# Patient Record
Sex: Female | Born: 1950
Health system: Southern US, Community
[De-identification: ages and names within clinical notes are randomized; demographics above are authoritative.]

## PROBLEM LIST (undated history)

## (undated) HISTORY — PX: OTHER SURGICAL HISTORY: SHX169

## (undated) HISTORY — PX: BUNIONECTOMY: SHX129

---

## 2006-08-16 HISTORY — PX: WRIST FRACTURE SURGERY: SHX121

## 2006-09-04 ENCOUNTER — Ambulatory Visit: Payer: Self-pay | Admitting: Unknown Physician Specialty

## 2009-09-16 HISTORY — PX: CATARACT EXTRACTION: SUR2

## 2010-07-04 ENCOUNTER — Emergency Department: Payer: Self-pay | Admitting: Unknown Physician Specialty

## 2018-10-20 ENCOUNTER — Ambulatory Visit: Payer: Medicare Other | Admitting: Family Medicine

## 2018-10-20 ENCOUNTER — Encounter: Payer: Self-pay | Admitting: Family Medicine

## 2018-10-20 VITALS — BP 124/72 | HR 72 | Temp 98.1°F | Ht 62.25 in | Wt 163.5 lb

## 2018-10-20 DIAGNOSIS — F419 Anxiety disorder, unspecified: Secondary | ICD-10-CM

## 2018-10-20 DIAGNOSIS — Z23 Encounter for immunization: Secondary | ICD-10-CM

## 2018-10-20 DIAGNOSIS — Z13220 Encounter for screening for lipoid disorders: Secondary | ICD-10-CM | POA: Diagnosis not present

## 2018-10-20 DIAGNOSIS — Z131 Encounter for screening for diabetes mellitus: Secondary | ICD-10-CM | POA: Diagnosis not present

## 2018-10-20 DIAGNOSIS — Z1159 Encounter for screening for other viral diseases: Secondary | ICD-10-CM | POA: Diagnosis not present

## 2018-10-20 DIAGNOSIS — Z1239 Encounter for other screening for malignant neoplasm of breast: Secondary | ICD-10-CM

## 2018-10-20 DIAGNOSIS — Z1211 Encounter for screening for malignant neoplasm of colon: Secondary | ICD-10-CM

## 2018-10-20 LAB — LIPID PANEL
CHOL/HDL RATIO: 5
Cholesterol: 218 mg/dL — ABNORMAL HIGH (ref 0–200)
HDL: 42.8 mg/dL (ref >39.00)
LDL Cholesterol: 140 mg/dL — ABNORMAL HIGH (ref 0–99)
NonHDL: 174.71
Triglycerides: 172 mg/dL — ABNORMAL HIGH (ref 0.0–149.0)
VLDL: 34.4 mg/dL (ref 0.0–40.0)

## 2018-10-20 LAB — HEMOGLOBIN A1C: Hgb A1c MFr Bld: 5.6 % (ref 4.6–6.5)

## 2018-10-20 MED ORDER — TETANUS-DIPHTH-ACELL PERTUSSIS 5-2-15.5 LF-MCG/0.5 IM SUSP: 0.5000 mL | Freq: Once | INTRAMUSCULAR | 0 refills | Status: AC

## 2018-10-20 NOTE — Patient Instructions (Addendum)
#  Cancer screening  - Get a Mammogram - Pick up the stool kit from the lab for colon cancer screening  #Anxiety/nausea - these do sound related - consider going to therapy - If too expensive or no improvement return to see me  #Blood work - to screen for Hepatitis C, Diabetes, and Cholesterol  #Immunizations - Go to [facility] to get your tetanus shot - We will do the Pneumonia Shot today

## 2018-10-20 NOTE — Progress Notes (Signed)
Subjective:     Alexandria Mason is a 68 y.o. female presenting for Establish Care (previous PCP Dr. Clelia Croft in 2013 or 2014. last time she was seen for an acute issue was with [facility] in 2016.) and GI Problem (sometimes nausea, stomach feels upset. Most of the time she is ok. She feels like this is related to nerves/anxiety/situational.)     GI Problem  The primary symptoms include nausea and diarrhea. Primary symptoms do not include fever, weight loss, fatigue, abdominal pain, vomiting, melena, hematemesis, hematochezia or myalgias. The illness began more than 7 days ago. The onset was gradual. The problem has not changed since onset. The illness does not include chills. Associated medical issues do not include liver disease or alcohol abuse. Risk factors: no risk factors.   Nausea is every few weeks. Only 1 day of symptoms Has been going  No heartburn symptoms Some diarrhea after eating fatty foods  #Anxiety - most days she feels well - exercises 3 days a week - possibly feeling anxious when having the nausea - hx of migraines which resolved when feeling less stress - symptoms are not daily - walks helpful, cleaning and doing tasks to help  - has never thought about therapy  Review of Systems  Constitutional: Negative for chills, fatigue, fever and weight loss.  Gastrointestinal: Positive for diarrhea and nausea. Negative for abdominal pain, hematemesis, hematochezia, melena and vomiting.  Musculoskeletal: Negative for myalgias.     Social History   Tobacco Use  Smoking Status Never Smoker  Smokeless Tobacco Never Used        Objective:    BP Readings from Last 3 Encounters:  10/20/18 124/72   Wt Readings from Last 3 Encounters:  10/20/18 163 lb 8 oz (74.2 kg)    BP 124/72   Pulse 72   Temp 98.1 F (36.7 C)   Ht 5' 2.25" (1.581 m)   Wt 163 lb 8 oz (74.2 kg)   SpO2 95%   BMI 29.66 kg/m    Physical Exam Constitutional:      General: She is  not in acute distress.    Appearance: She is well-developed. She is not diaphoretic.  HENT:     Right Ear: External ear normal.     Left Ear: External ear normal.     Nose: Nose normal.  Eyes:     Conjunctiva/sclera: Conjunctivae normal.  Neck:     Musculoskeletal: Neck supple.  Cardiovascular:     Rate and Rhythm: Normal rate and regular rhythm.     Heart sounds: No murmur.  Pulmonary:     Effort: Pulmonary effort is normal. No respiratory distress.     Breath sounds: Normal breath sounds. No wheezing.  Abdominal:     General: Bowel sounds are normal. There is no distension.     Palpations: Abdomen is soft. There is no mass.     Tenderness: There is no abdominal tenderness. There is no guarding.  Skin:    General: Skin is warm and dry.     Capillary Refill: Capillary refill takes less than 2 seconds.  Neurological:     Mental Status: She is alert. Mental status is at baseline.  Psychiatric:        Mood and Affect: Mood normal.        Behavior: Behavior normal.           Assessment & Plan:   Problem List Items Addressed This Visit      Other  Anxiety - Primary    Mildly elevated GAD. She thinks intermittent nausea may be related. Difficult to obtain clear severity of symptoms, though on questionnaire do not seem to be occurring often. Advised therapy to see if that will help articulate. While describing symptoms would run off in tangents regarding concerns for daughters health so do wonder if she is someone who may have more symptoms than she is endorsing. Continue to monitor. Start with therapy if not too expensive       Other Visit Diagnoses    Screening for diabetes mellitus       Relevant Orders   Hemoglobin A1c   Screening for hyperlipidemia       Relevant Orders   Lipid panel   Encounter for hepatitis C screening test for low risk patient       Relevant Orders   Hepatitis C antibody   Need for Tdap vaccination       Need for pneumococcal vaccination         Relevant Orders   Pneumococcal conjugate vaccine 13-valent   Colon cancer screening       Relevant Orders   Fecal occult blood, imunochemical   Breast cancer screening       Relevant Orders   MM 3D SCREEN BREAST BILATERAL     No recent primary care visit and has never done any age appropriate screening.   Will start with above. Consider DEXA at next visit  Return in about 1 year (around 10/21/2019).  Lynnda Child, MD

## 2018-10-20 NOTE — Assessment & Plan Note (Signed)
Mildly elevated GAD. She thinks intermittent nausea may be related. Difficult to obtain clear severity of symptoms, though on questionnaire do not seem to be occurring often. Advised therapy to see if that will help articulate. While describing symptoms would run off in tangents regarding concerns for daughters health so do wonder if she is someone who may have more symptoms than she is endorsing. Continue to monitor. Start with therapy if not too expensive

## 2018-10-21 ENCOUNTER — Other Ambulatory Visit: Payer: Self-pay | Admitting: Family Medicine

## 2018-10-21 DIAGNOSIS — E785 Hyperlipidemia, unspecified: Secondary | ICD-10-CM

## 2018-10-21 LAB — HEPATITIS C ANTIBODY
Hepatitis C Ab: NONREACTIVE
SIGNAL TO CUT-OFF: 0.01 (ref <1.00)

## 2018-10-21 NOTE — Telephone Encounter (Signed)
Called patient to relay lab results. Pt unavailable to left voicemail to call back  Routing to MA to discuss if patient calls back   1) Hepatitis C negative  2) No sign of diabetes  3) Cholesterol is high. Based on results taking a statin medication every day could help decrease your risk for heart attack or stroke.   -- Would also recommend regular exercise and a healthy diet.    I've pended a medication to start, if you would like we can send it to the pharmacy.   Rare side effect is muscle cramps.  If you would like to discuss in person, make an appointment and we can talk about it more detail.

## 2018-10-22 MED ORDER — ATORVASTATIN CALCIUM 10 MG PO TABS: 10.0000 mg | ORAL_TABLET | Freq: Every day | ORAL | 1 refills | Status: DC

## 2018-10-22 NOTE — Telephone Encounter (Signed)
Pt returned call for her lab results.

## 2018-10-22 NOTE — Telephone Encounter (Signed)
Spoke with patient and advised as below. Patient agrees to start medication. RX sent in to the pharmacy.

## 2018-11-16 ENCOUNTER — Encounter: Payer: Self-pay | Admitting: Family Medicine

## 2018-11-16 ENCOUNTER — Ambulatory Visit: Payer: Medicare Other | Admitting: Family Medicine

## 2018-11-16 VITALS — BP 118/62 | HR 105 | Temp 98.2°F | Resp 20 | Ht 62.25 in | Wt 160.8 lb

## 2018-11-16 DIAGNOSIS — J101 Influenza due to other identified influenza virus with other respiratory manifestations: Secondary | ICD-10-CM

## 2018-11-16 LAB — POCT INFLUENZA A/B
Influenza A, POC: POSITIVE — AB
Influenza B, POC: NEGATIVE

## 2018-11-16 MED ORDER — BENZONATATE 100 MG PO CAPS: 100.0000 mg | ORAL_CAPSULE | Freq: Two times a day (BID) | ORAL | 0 refills | Status: DC | PRN

## 2018-11-16 MED ORDER — OSELTAMIVIR PHOSPHATE 75 MG PO CAPS: 75.0000 mg | ORAL_CAPSULE | Freq: Two times a day (BID) | ORAL | 0 refills | Status: AC

## 2018-11-16 NOTE — Patient Instructions (Signed)
You have the flu.   This is highly contagious -- you are contagious 1-2 days before you develop symptoms and for up to 7 days after becoming sick.   Oseltamivir (Tamiflu) -- is helpful if started within 48 hours of symptoms. Ideally within 24 hours of symptoms. This may decrease the length of illness by 1 day and decrease the severity of your illness.   Based on your symptoms, it looks like you have a virus.     1. Drink plenty of fluids 2. Get lots of rest  Sinus Congestion 1) Neti Pot (Saline rinse) -- 2 times day -- if tolerated 2) Flonase (Store Brand ok) - once daily 3) Over the counter congestion medications  Cough 1) Cough drops can be helpful 2) Nyquil (or nighttime cough medication) 3) Honey is proven to be one of the best cough medications   Sore Throat 1) Honey as above, cough drops 2) Ibuprofen or Aleve can be helpful 3) Salt water Gargles  If you develop fevers (Temperature >100.4), chills, worsening symptoms or symptoms lasting longer than 10 days return to clinic.

## 2018-11-16 NOTE — Progress Notes (Signed)
Subjective:     Alexandria Mason is a 68 y.o. female presenting for Cough (symptoms started on 11/14/2018. Weakness, runny nose, body aches today, chills. )     URI   This is a new problem. The current episode started in the past 7 days. The problem has been gradually worsening. There has been no fever. Associated symptoms include congestion, coughing, headaches, rhinorrhea, sinus pain and vomiting. Pertinent negatives include no abdominal pain, chest pain, diarrhea, ear pain, joint pain, nausea, plugged ear sensation, sneezing or sore throat. She has tried NSAIDs (honey, vit C) for the symptoms. The treatment provided mild relief.     Review of Systems  Constitutional: Positive for chills. Negative for fever.  HENT: Positive for congestion, rhinorrhea and sinus pain. Negative for ear pain, sneezing and sore throat.   Respiratory: Positive for cough.   Cardiovascular: Negative for chest pain.  Gastrointestinal: Positive for vomiting. Negative for abdominal pain, diarrhea and nausea.  Musculoskeletal: Positive for myalgias. Negative for joint pain.  Neurological: Positive for headaches.     Social History   Tobacco Use  Smoking Status Never Smoker  Smokeless Tobacco Never Used        Objective:    BP Readings from Last 3 Encounters:  11/16/18 118/62  10/20/18 124/72   Wt Readings from Last 3 Encounters:  11/16/18 160 lb 12 oz (72.9 kg)  10/20/18 163 lb 8 oz (74.2 kg)    BP 118/62   Pulse (!) 105   Temp 98.2 F (36.8 C)   Resp 20   Ht 5' 2.25" (1.581 m)   Wt 160 lb 12 oz (72.9 kg)   SpO2 95%   BMI 29.17 kg/m    Physical Exam Constitutional:      General: She is not in acute distress.    Appearance: She is well-developed. She is not diaphoretic.  HENT:     Head: Normocephalic and atraumatic.     Right Ear: Tympanic membrane and ear canal normal.     Left Ear: Tympanic membrane and ear canal normal.     Nose: Mucosal edema and rhinorrhea present.   Right Sinus: No maxillary sinus tenderness or frontal sinus tenderness.     Left Sinus: No maxillary sinus tenderness or frontal sinus tenderness.     Mouth/Throat:     Pharynx: Uvula midline. Posterior oropharyngeal erythema present. No oropharyngeal exudate.     Tonsils: Swelling: 0 on the right. 0 on the left.  Eyes:     General: No scleral icterus.    Conjunctiva/sclera: Conjunctivae normal.  Neck:     Musculoskeletal: Neck supple.  Cardiovascular:     Rate and Rhythm: Normal rate and regular rhythm.     Heart sounds: Normal heart sounds. No murmur.  Pulmonary:     Effort: Pulmonary effort is normal. No respiratory distress.     Breath sounds: Normal breath sounds.  Lymphadenopathy:     Cervical: No cervical adenopathy.  Skin:    General: Skin is warm and dry.     Capillary Refill: Capillary refill takes less than 2 seconds.  Neurological:     Mental Status: She is alert.      Rapid flu: positive     Assessment & Plan:   Problem List Items Addressed This Visit    None    Visit Diagnoses    Influenza A    -  Primary   Relevant Medications   oseltamivir (TAMIFLU) 75 MG capsule   benzonatate (TESSALON) 100  MG capsule   Other Relevant Orders   POCT Influenza A/B (Completed)     Symptomatic care. Within the window for tamiflu.   Return if symptoms worsen or fail to improve.  Lynnda Child, MD

## 2019-04-14 ENCOUNTER — Other Ambulatory Visit: Payer: Self-pay | Admitting: Family Medicine

## 2019-04-14 DIAGNOSIS — E785 Hyperlipidemia, unspecified: Secondary | ICD-10-CM

## 2019-10-31 ENCOUNTER — Other Ambulatory Visit: Payer: Self-pay | Admitting: Family Medicine

## 2019-10-31 DIAGNOSIS — E785 Hyperlipidemia, unspecified: Secondary | ICD-10-CM

## 2019-11-01 NOTE — Telephone Encounter (Signed)
Please schedule a physical for patient when possible. Thank you

## 2019-11-03 NOTE — Telephone Encounter (Signed)
lvm asking pt to call office °

## 2019-11-12 NOTE — Telephone Encounter (Signed)
Noted. Refill sent in 

## 2019-11-12 NOTE — Telephone Encounter (Signed)
Pt is scheduled in march for physical.

## 2019-11-26 ENCOUNTER — Ambulatory Visit (INDEPENDENT_AMBULATORY_CARE_PROVIDER_SITE_OTHER): Payer: Medicare Other

## 2019-11-26 DIAGNOSIS — Z0000 Encounter for general adult medical examination without abnormal findings: Secondary | ICD-10-CM

## 2019-11-26 NOTE — Progress Notes (Signed)
Subjective:   Shawnna Pancake is a 69 y.o. female who presents for Medicare Annual (Subsequent) preventive examination.  Review of Systems: N/A   This visit is being conducted through telemedicine via telephone at the nurse health advisor's home address due to the COVID-19 pandemic. This patient has given me verbal consent via doximity to conduct this visit, patient states they are participating from their home address. Patient and myself are on the telephone call. There is no referral for this visit. Some vital signs may be absent or patient reported.    Patient identification: identified by name, DOB, and current address   Cardiac Risk Factors include: advanced age (>92men, >25 women)     Objective:     Vitals: There were no vitals taken for this visit.  There is no height or weight on file to calculate BMI.  Advanced Directives 11/26/2019  Does Patient Have a Medical Advance Directive? No  Would patient like information on creating a medical advance directive? No - Patient declined    Tobacco Social History   Tobacco Use  Smoking Status Never Smoker  Smokeless Tobacco Never Used     Counseling given: Not Answered   Clinical Intake:  Pre-visit preparation completed: Yes  Pain : No/denies pain     Nutritional Risks: None Diabetes: No  How often do you need to have someone help you when you read instructions, pamphlets, or other written materials from your doctor or pharmacy?: 1 - Never What is the last grade level you completed in school?: 2 years of college  Interpreter Needed?: No  Information entered by :: CJohnson, LPN  History reviewed. No pertinent past medical history. Past Surgical History:  Procedure Laterality Date  . BUNIONECTOMY     x 2-bilateral feet- different years for each one  . CATARACT EXTRACTION Left 2011  . oral surgeries    . WRIST FRACTURE SURGERY Right 08/2006   metal plate present   Family History  Problem Relation Age of  Onset  . Congestive Heart Failure Mother   . Heart disease Father   . Heart attack Father 28  . Heart disease Brother   . Heart attack Paternal Grandfather    Social History   Socioeconomic History  . Marital status: Divorced    Spouse name: Not on file  . Number of children: 1  . Years of education: some college  . Highest education level: Not on file  Occupational History  . Not on file  Tobacco Use  . Smoking status: Never Smoker  . Smokeless tobacco: Never Used  Substance and Sexual Activity  . Alcohol use: Not Currently    Comment: rarely  . Drug use: Never  . Sexual activity: Not Currently  Other Topics Concern  . Not on file  Social History Narrative   Living on social security, retired from [employer] alone   One daughter - Efraim Kaufmann    Enjoys: going to the gym, walking with her neighbor   Exercise: gym 3 times a week   Social support - yes, good friends, daughter   Diet: pretty healthy - salads, meats, chicken   Social Determinants of Health   Financial Resource Strain: Low Risk   . Difficulty of Paying Living Expenses: Not hard at all  Food Insecurity: No Food Insecurity  . Worried About Programme researcher, broadcasting/film/video in the Last Year: Never true  . Ran Out of Food in the Last Year: Never true  Transportation Needs: No Transportation Needs  .  Lack of Transportation (Medical): No  . Lack of Transportation (Non-Medical): No  Physical Activity: Sufficiently Active  . Days of Exercise per Week: 7 days  . Minutes of Exercise per Session: 30 min  Stress: No Stress Concern Present  . Feeling of Stress : Not at all  Social Connections:   . Frequency of Communication with Friends and Family:   . Frequency of Social Gatherings with Friends and Family:   . Attends Religious Services:   . Active Member of Clubs or Organizations:   . Attends Archivist Meetings:   Marland Kitchen Marital Status:     Outpatient Encounter Medications as of 11/26/2019  Medication Sig  .  atorvastatin (LIPITOR) 10 MG tablet TAKE 1 TABLET BY MOUTH EVERY DAY  . Naproxen Sodium (ALEVE) 220 MG CAPS Take by mouth daily as needed.  . benzonatate (TESSALON) 100 MG capsule Take 1 capsule (100 mg total) by mouth 2 (two) times daily as needed for cough. (Patient not taking: Reported on 11/26/2019)   No facility-administered encounter medications on file as of 11/26/2019.    Activities of Daily Living In your present state of health, do you have any difficulty performing the following activities: 11/26/2019  Hearing? N  Vision? N  Difficulty concentrating or making decisions? N  Walking or climbing stairs? N  Dressing or bathing? N  Doing errands, shopping? N  Preparing Food and eating ? N  Using the Toilet? N  In the past six months, have you accidently leaked urine? N  Do you have problems with loss of bowel control? N  Managing your Medications? N  Managing your Finances? N  Housekeeping or managing your Housekeeping? N  Some recent data might be hidden    Patient Care Team: Lesleigh Noe, MD as PCP - General (Family Medicine)    Assessment:   This is a routine wellness examination for [place].  Exercise Activities and Dietary recommendations Current Exercise Habits: Home exercise routine, Type of exercise: walking, Time (Minutes): 30, Frequency (Times/Week): 7, Weekly Exercise (Minutes/Week): 210, Intensity: Moderate, Exercise limited by: None identified  Goals    . Patient Stated     11/26/2019, I will continue walking everyday for 30 minutes.        Fall Risk Fall Risk  11/26/2019 10/20/2018  Falls in the past year? 1 0  Comment tripped over speed bump at [employer] -  Number falls in past yr: 0 0  Injury with Fall? 0 0  Risk for fall due to : No Fall Risks -  Follow up Falls evaluation completed;Falls prevention discussed -   Is the patient's home free of loose throw rugs in walkways, pet beds, electrical cords, etc?   yes      Grab bars in the bathroom?  no      Handrails on the stairs?   yes      Adequate lighting?   yes  Timed Get Up and Go performed: N/A  Depression Screen PHQ 2/9 Scores 11/26/2019 10/20/2018  PHQ - 2 Score 0 0  PHQ- 9 Score 0 0     Cognitive Function MMSE - Mini Mental State Exam 11/26/2019  Orientation to time 5  Orientation to Place 5  Registration 3  Attention/ Calculation 5  Recall 3  Language- repeat 1       Mini Cog  Mini-Cog screen was completed. Maximum score is 22. A value of 0 denotes this part of the MMSE was not completed or the patient failed  this part of the Mini-Cog screening.  Immunization History  Administered Date(s) Administered  . Pneumococcal Conjugate-13 10/20/2018  . Tdap 10/23/2018    Qualifies for Shingles Vaccine: Yes  Screening Tests Health Maintenance  Topic Date Due  . MAMMOGRAM  Never done  . DEXA SCAN  Never done  . PNA vac Low Risk Adult (2 of 2 - PPSV23) 10/21/2019  . COLONOSCOPY  11/26/2023 (Originally 11/06/1999)  . INFLUENZA VACCINE  12/15/2023 (Originally 04/17/2019)  . TETANUS/TDAP  10/23/2028  . Hepatitis C Screening  Completed    Cancer Screenings: Lung: Low Dose CT Chest recommended if Age 77-80 years, 30 pack-year currently smoking OR have quit w/in 15 years. Patient does not qualify. Breast: Up to date on Mammogram: No, postponed to later date   Up to date of Bone Density/Dexa: No, postponed to later date  Colorectal: declined  Additional Screenings:  Hepatitis C Screening: 10/20/2018     Plan:   Patient will continue walking daily for 30 minutes.    I have personally reviewed and noted the following in the patient's chart:   . Medical and social history . Use of alcohol, tobacco or illicit drugs  . Current medications and supplements . Functional ability and status . Nutritional status . Physical activity . Advanced directives . List of other physicians . Hospitalizations, surgeries, and ER visits in previous 12 months . Vitals . Screenings  to include cognitive, depression, and falls . Referrals and appointments  In addition, I have reviewed and discussed with patient certain preventive protocols, quality metrics, and best practice recommendations. A written personalized care plan for preventive services as well as general preventive health recommendations were provided to patient.     Janalyn Shy, LPN  4/31/5400

## 2019-11-26 NOTE — Progress Notes (Signed)
PCP notes:  Health Maintenance: Pneumovax 23- due, Patient wants to get this Flu vaccine- declined Colonoscopy- declined Mammogram- postpone til COVID better Dexa- postpone til COVID better   Abnormal Screenings: none   Patient concerns: none   Nurse concerns: none   Next PCP appt.: 11/30/2019 @ 10:40 am

## 2019-11-26 NOTE — Patient Instructions (Signed)
Alexandria Mason , Thank you for taking time to come for your Medicare Wellness Visit. I appreciate your ongoing commitment to your health goals. Please review the following plan we discussed and let me know if I can assist you in the future.   Screening recommendations/referrals: Colonoscopy: declined Mammogram: postpone Bone Density: postpone Recommended yearly ophthalmology/optometry visit for glaucoma screening and checkup Recommended yearly dental visit for hygiene and checkup  Vaccinations: Influenza vaccine: declined Pneumococcal vaccine: due Tdap vaccine: Up to date, completed 10/23/2018 Shingles vaccine: discussed    Advanced directives: Advance directive discussed with you today. Even though you declined this today please call our office should you change your mind and we can give you the proper paperwork for you to fill out.  Conditions/risks identified: none  Next appointment: 11/30/2019 @ 10:40 am    Preventive Care 65 Years and Older, Female Preventive care refers to lifestyle choices and visits with your health care provider that can promote health and wellness. What does preventive care include?  A yearly physical exam. This is also called an annual well check.  Dental exams once or twice a year.  Routine eye exams. Ask your health care provider how often you should have your eyes checked.  Personal lifestyle choices, including:  Daily care of your teeth and gums.  Regular physical activity.  Eating a healthy diet.  Avoiding tobacco and drug use.  Limiting alcohol use.  Practicing safe sex.  Taking low-dose aspirin every day.  Taking vitamin and mineral supplements as recommended by your health care provider. What happens during an annual well check? The services and screenings done by your health care provider during your annual well check will depend on your age, overall health, lifestyle risk factors, and family history of disease. Counseling  Your  health care provider may ask you questions about your:  Alcohol use.  Tobacco use.  Drug use.  Emotional well-being.  Home and relationship well-being.  Sexual activity.  Eating habits.  History of falls.  Memory and ability to understand (cognition).  Work and work Astronomer.  Reproductive health. Screening  You may have the following tests or measurements:  Height, weight, and BMI.  Blood pressure.  Lipid and cholesterol levels. These may be checked every 5 years, or more frequently if you are over 12 years old.  Skin check.  Lung cancer screening. You may have this screening every year starting at age 25 if you have a 30-pack-year history of smoking and currently smoke or have quit within the past 15 years.  Fecal occult blood test (FOBT) of the stool. You may have this test every year starting at age 61.  Flexible sigmoidoscopy or colonoscopy. You may have a sigmoidoscopy every 5 years or a colonoscopy every 10 years starting at age 56.  Hepatitis C blood test.  Hepatitis B blood test.  Sexually transmitted disease (STD) testing.  Diabetes screening. This is done by checking your blood sugar (glucose) after you have not eaten for a while (fasting). You may have this done every 1-3 years.  Bone density scan. This is done to screen for osteoporosis. You may have this done starting at age 61.  Mammogram. This may be done every 1-2 years. Talk to your health care provider about how often you should have regular mammograms. Talk with your health care provider about your test results, treatment options, and if necessary, the need for more tests. Vaccines  Your health care provider may recommend certain vaccines, such as:  Influenza  vaccine. This is recommended every year.  Tetanus, diphtheria, and acellular pertussis (Tdap, Td) vaccine. You may need a Td booster every 10 years.  Zoster vaccine. You may need this after age 53.  Pneumococcal 13-valent  conjugate (PCV13) vaccine. One dose is recommended after age 8.  Pneumococcal polysaccharide (PPSV23) vaccine. One dose is recommended after age 48. Talk to your health care provider about which screenings and vaccines you need and how often you need them. This information is not intended to replace advice given to you by your health care provider. Make sure you discuss any questions you have with your health care provider. Document Released: 09/29/2015 Document Revised: 05/22/2016 Document Reviewed: 07/04/2015 Elsevier Interactive Patient Education  2017 [place] Prevention in the Home Falls can cause injuries. They can happen to people of all ages. There are many things you can do to make your home safe and to help prevent falls. What can I do on the outside of my home?  Regularly fix the edges of walkways and driveways and fix any cracks.  Remove anything that might make you trip as you walk through a door, such as a raised step or threshold.  Trim any bushes or trees on the path to your home.  Use bright outdoor lighting.  Clear any walking paths of anything that might make someone trip, such as rocks or tools.  Regularly check to see if handrails are loose or broken. Make sure that both sides of any steps have handrails.  Any raised decks and porches should have guardrails on the edges.  Have any leaves, snow, or ice cleared regularly.  Use sand or salt on walking paths during winter.  Clean up any spills in your garage right away. This includes oil or grease spills. What can I do in the bathroom?  Use night lights.  Install grab bars by the toilet and in the tub and shower. Do not use towel bars as grab bars.  Use non-skid mats or decals in the tub or shower.  If you need to sit down in the shower, use a plastic, non-slip stool.  Keep the floor dry. Clean up any water that spills on the floor as soon as it happens.  Remove soap buildup in the tub or  shower regularly.  Attach bath mats securely with double-sided non-slip rug tape.  Do not have throw rugs and other things on the floor that can make you trip. What can I do in the bedroom?  Use night lights.  Make sure that you have a light by your bed that is easy to reach.  Do not use any sheets or blankets that are too big for your bed. They should not hang down onto the floor.  Have a firm chair that has side arms. You can use this for support while you get dressed.  Do not have throw rugs and other things on the floor that can make you trip. What can I do in the kitchen?  Clean up any spills right away.  Avoid walking on wet floors.  Keep items that you use a lot in easy-to-reach places.  If you need to reach something above you, use a strong step stool that has a grab bar.  Keep electrical cords out of the way.  Do not use floor polish or wax that makes floors slippery. If you must use wax, use non-skid floor wax.  Do not have throw rugs and other things on the floor that can  make you trip. What can I do with my stairs?  Do not leave any items on the stairs.  Make sure that there are handrails on both sides of the stairs and use them. Fix handrails that are broken or loose. Make sure that handrails are as long as the stairways.  Check any carpeting to make sure that it is firmly attached to the stairs. Fix any carpet that is loose or worn.  Avoid having throw rugs at the top or bottom of the stairs. If you do have throw rugs, attach them to the floor with carpet tape.  Make sure that you have a light switch at the top of the stairs and the bottom of the stairs. If you do not have them, ask someone to add them for you. What else can I do to help prevent falls?  Wear shoes that:  Do not have high heels.  Have rubber bottoms.  Are comfortable and fit you well.  Are closed at the toe. Do not wear sandals.  If you use a stepladder:  Make sure that it is fully  opened. Do not climb a closed stepladder.  Make sure that both sides of the stepladder are locked into place.  Ask someone to hold it for you, if possible.  Clearly mark and make sure that you can see:  Any grab bars or handrails.  First and last steps.  Where the edge of each step is.  Use tools that help you move around (mobility aids) if they are needed. These include:  Canes.  Walkers.  Scooters.  Crutches.  Turn on the lights when you go into a dark area. Replace any light bulbs as soon as they burn out.  Set up your furniture so you have a clear path. Avoid moving your furniture around.  If any of your floors are uneven, fix them.  If there are any pets around you, be aware of where they are.  Review your medicines with your doctor. Some medicines can make you feel dizzy. This can increase your chance of falling. Ask your doctor what other things that you can do to help prevent falls. This information is not intended to replace advice given to you by your health care provider. Make sure you discuss any questions you have with your health care provider. Document Released: 06/29/2009 Document Revised: 02/08/2016 Document Reviewed: 10/07/2014 Elsevier Interactive Patient Education  2017 [employer].

## 2019-11-30 ENCOUNTER — Encounter: Payer: Self-pay | Admitting: Family Medicine

## 2019-11-30 ENCOUNTER — Other Ambulatory Visit: Payer: Self-pay

## 2019-11-30 ENCOUNTER — Ambulatory Visit (INDEPENDENT_AMBULATORY_CARE_PROVIDER_SITE_OTHER): Payer: Medicare Other | Admitting: Family Medicine

## 2019-11-30 VITALS — BP 114/68 | HR 73 | Temp 97.3°F | Ht 62.25 in | Wt 173.5 lb

## 2019-11-30 DIAGNOSIS — Z1211 Encounter for screening for malignant neoplasm of colon: Secondary | ICD-10-CM

## 2019-11-30 DIAGNOSIS — H269 Unspecified cataract: Secondary | ICD-10-CM

## 2019-11-30 DIAGNOSIS — Z23 Encounter for immunization: Secondary | ICD-10-CM

## 2019-11-30 DIAGNOSIS — E782 Mixed hyperlipidemia: Secondary | ICD-10-CM

## 2019-11-30 DIAGNOSIS — Z0000 Encounter for general adult medical examination without abnormal findings: Secondary | ICD-10-CM | POA: Diagnosis not present

## 2019-11-30 LAB — LIPID PANEL
Cholesterol: 172 mg/dL (ref 0–200)
HDL: 45.9 mg/dL (ref >39.00)
LDL Cholesterol: 95 mg/dL (ref 0–99)
NonHDL: 126.19
Total CHOL/HDL Ratio: 4
Triglycerides: 154 mg/dL — ABNORMAL HIGH (ref 0.0–149.0)
VLDL: 30.8 mg/dL (ref 0.0–40.0)

## 2019-11-30 NOTE — Progress Notes (Signed)
Annual Exam   Chief Complaint:  Chief Complaint  Patient presents with  . Annual Exam    part 2    History of Present Illness:  Ms. Alexandria Mason is a 69 y.o. No obstetric history on file. who LMP was No LMP recorded. Patient is postmenopausal., presents today for her annual examination.    Covid vaccine - hoping to get the Wynetta Emery and [employer] vaccine     Nutrition She does get adequate calcium and Vitamin D in her diet. Diet: bacon, eggs, veggies, salads, beef/chicken/seafood Exercise: walks with her neighbor 1 mile daily, belongs to a gym  Safety The patient wears seatbelts: yes.     The patient feels safe at home and in their relationships: yes.   Menstrual Post menopausal  GYN She is not sexually active.    Cervical Cancer Screening:   Not indicated  Breast Cancer Screening There is no FH of breast cancer. There is no FH of ovarian cancer. BRCA screening Not Indicated.  Last Mammogram: never The patient does want a mammogram this year. -- hoping to delay due to covid   Colon Cancer Screening Age 48-75 yo - benefits outweigh the risk. Adults 4-85 yo who have never been screened benefit.  Benefits: 134000 people in 2016 will be diagnosed and 49,000 will die - early detection helps [place]: Complications 2/2 to colonoscopy High Risk (Colonoscopy): genetic disorder (Lynch syndrome or familial adenomatous polyposis), personal hx of IBD, previous adenomatous polyp, or previous colorectal cancer, FamHx start 10 years before the age at diagnosis, increased in males and black race  Options:  FIT - looks for hemoglobin (blood in the stool) - specific and fairly sensitive - must be done annually Cologuard - looks for DNA and blood - more sensitive - therefore can have more false positives, every 3 years Colonoscopy - every 10 years if normal - sedation, bowl prep, must have someone drive you  Shared decision making and the patient had decided to do FOBT.   Lung  Cancer Screening Annual screening for adults age 63-80 yo with 30 year pack history? No Current Tobacco user? No Quit less than 15 years ago? No Interested in low dose CT for lung cancer screening? not applicable    Tobacco Use: Low Risk   . Smoking Tobacco Use: Never Smoker  . Smokeless Tobacco Use: Never Used      Weight Wt Readings from Last 3 Encounters:  11/30/19 173 lb 8 oz (78.7 kg)  11/16/18 160 lb 12 oz (72.9 kg)  10/20/18 163 lb 8 oz (74.2 kg)   Patient has high BMI  BMI Readings from Last 1 Encounters:  11/30/19 31.48 kg/m     Chronic disease screening Blood pressure monitoring:  BP Readings from Last 3 Encounters:  11/30/19 114/68  11/16/18 118/62  10/20/18 124/72    Lipid Monitoring: Indication for screening: age >8, obesity, diabetes, family hx, CV risk factors.  Lipid screening: Yes  Lab Results  Component Value Date   CHOL 218 (H) 10/20/2018   HDL 42.80 10/20/2018   LDLCALC 140 (H) 10/20/2018   TRIG 172.0 (H) 10/20/2018   CHOLHDL 5 10/20/2018     Diabetes Screening: age >66, overweight, family hx, PCOS, hx of gestational diabetes, at risk ethnicity Diabetes Screening screening: Not Indicated  Lab Results  Component Value Date   HGBA1C 5.6 10/20/2018     No past medical history on file.  Past Surgical History:  Procedure Laterality Date  . BUNIONECTOMY  x 2-bilateral feet- different years for each one  . CATARACT EXTRACTION Left 2011  . oral surgeries    . WRIST FRACTURE SURGERY Right 08/2006   metal plate present    Prior to Admission medications   Medication Sig Start Date End Date Taking? Authorizing Provider  atorvastatin (LIPITOR) 10 MG tablet TAKE 1 TABLET BY MOUTH EVERY DAY 11/12/19  Yes Lesleigh Noe, MD  Naproxen Sodium (ALEVE) 220 MG CAPS Take by mouth daily as needed.   Yes [provider]  benzonatate (TESSALON) 100 MG capsule Take 1 capsule (100 mg total) by mouth 2 (two) times daily as needed for  cough. Patient not taking: Reported on 11/26/2019 11/16/18   Lesleigh Noe, MD    Allergies  Allergen Reactions  . Oxycodone-Acetaminophen     Hallucinations  . Sulfa Antibiotics     Upset stomach    Gynecologic History: No LMP recorded. Patient is postmenopausal.  Obstetric History: No obstetric history on file.  Social History   Socioeconomic History  . Marital status: Divorced    Spouse name: Not on file  . Number of children: 1  . Years of education: some college  . Highest education level: Not on file  Occupational History  . Not on file  Tobacco Use  . Smoking status: Never Smoker  . Smokeless tobacco: Never Used  Substance and Sexual Activity  . Alcohol use: Not Currently    Comment: rarely  . Drug use: Never  . Sexual activity: Not Currently  Other Topics Concern  . Not on file  Social History Narrative   Living on social security, retired from [employer] alone   One daughter - Lenna Sciara    Enjoys: going to the gym, walking with her neighbor   Exercise: gym 3 times a week   Social support - yes, good friends, daughter   Diet: pretty healthy - salads, meats, chicken   Social Determinants of Health   Financial Resource Strain: Low Risk   . Difficulty of Paying Living Expenses: Not hard at all  Food Insecurity: No Food Insecurity  . Worried About Charity fundraiser in the Last Year: Never true  . Ran Out of Food in the Last Year: Never true  Transportation Needs: No Transportation Needs  . Lack of Transportation (Medical): No  . Lack of Transportation (Non-Medical): No  Physical Activity: Sufficiently Active  . Days of Exercise per Week: 7 days  . Minutes of Exercise per Session: 30 min  Stress: No Stress Concern Present  . Feeling of Stress : Not at all  Social Connections:   . Frequency of Communication with Friends and Family:   . Frequency of Social Gatherings with Friends and Family:   . Attends Religious Services:   . Active Member of Clubs  or Organizations:   . Attends Archivist Meetings:   Marland Kitchen Marital Status:   Intimate Partner Violence: Not At Risk  . Fear of Current or Ex-Partner: No  . Emotionally Abused: No  . Physically Abused: No  . Sexually Abused: No    Family History  Problem Relation Age of Onset  . Congestive Heart Failure Mother   . Heart disease Father   . Heart attack Father 62  . Heart disease Brother   . Heart attack Paternal Grandfather     Review of Systems  Constitutional: Negative for chills and fever.  HENT: Negative for congestion and sore throat.   Eyes: Negative for blurred vision  and double vision.  Respiratory: Negative for shortness of breath.   Cardiovascular: Negative for chest pain.  Gastrointestinal: Negative for heartburn, nausea and vomiting.  Genitourinary: Negative.   Musculoskeletal: Negative.  Negative for myalgias.  Skin: Negative for rash.  Neurological: Negative for dizziness and headaches.  Endo/Heme/Allergies: Does not bruise/bleed easily.  Psychiatric/Behavioral: Negative for depression. The patient is not nervous/anxious.      Physical Exam BP 114/68   Pulse 73   Temp (!) 97.3 F (36.3 C)   Ht 5' 2.25" (1.581 m)   Wt 173 lb 8 oz (78.7 kg)   SpO2 98%   BMI 31.48 kg/m    BP Readings from Last 3 Encounters:  11/30/19 114/68  11/16/18 118/62  10/20/18 124/72      Physical Exam Constitutional:      General: She is not in acute distress.    Appearance: She is well-developed. She is not diaphoretic.  HENT:     Head: Normocephalic and atraumatic.     Right Ear: Tympanic membrane and external ear normal.     Left Ear: Tympanic membrane and external ear normal.     Nose: Nose normal.  Eyes:     General: No scleral icterus.    Conjunctiva/sclera: Conjunctivae normal.  Cardiovascular:     Rate and Rhythm: Normal rate and regular rhythm.     Heart sounds: No murmur.  Pulmonary:     Effort: Pulmonary effort is normal. No respiratory distress.      Breath sounds: Normal breath sounds. No wheezing.  Abdominal:     General: Bowel sounds are normal. There is no distension.     Palpations: Abdomen is soft. There is no mass.     Tenderness: There is no abdominal tenderness. There is no guarding or rebound.  Musculoskeletal:        General: Normal range of motion.     Cervical back: Neck supple.  Lymphadenopathy:     Cervical: No cervical adenopathy.  Skin:    General: Skin is warm and dry.     Capillary Refill: Capillary refill takes less than 2 seconds.  Neurological:     Mental Status: She is alert and oriented to person, place, and time.     Deep Tendon Reflexes: Reflexes normal.  Psychiatric:        Behavior: Behavior normal.      Results:  PHQ-9:    Clinical Support from 11/26/2019 in [place] at [facility]  PHQ-9 Total Score  0        Assessment: 69 y.o. No obstetric history on file. female here for routine annual physical examination.  Plan: Problem List Items Addressed This Visit    None    Visit Diagnoses    Annual physical exam    -  Primary   Relevant Orders   Pneumococcal polysaccharide vaccine 23-valent greater than or equal to 2yo subcutaneous/IM (Completed)   Cataract of both eyes, unspecified cataract type       Cataract of left eye, unspecified cataract type       Relevant Orders   Ambulatory referral to Ophthalmology   Colon cancer screening       Relevant Orders   Fecal occult blood, imunochemical   Mixed hyperlipidemia       Relevant Orders   Lipid panel      Screening: -- Blood pressure screen normal -- cholesterol screening: will obtain -- Weight screening: overweight: continue to monitor -- Diabetes Screening: not due for screening --  Nutrition: normal - encouraged calorie counting for weight loss and healthy diet  The 10-year ASCVD risk score Mikey Bussing DC Jr., et al., 2013) is: 8.2%   Values used to calculate the score:     Age: 43 years     Sex: Female     Is  Non-Hispanic African American: No     Diabetic: No     Tobacco smoker: No     Systolic Blood Pressure: 481 mmHg     Is BP treated: No     HDL Cholesterol: 42.8 mg/dL     Total Cholesterol: 218 mg/dL  -- Statin therapy for Age 74-75 with CVD risk >7.5%  Pt on statin will repeat to assess response  Psych -- Depression screening (PHQ-9):    Clinical Support from 11/26/2019 in [place] at [facility]  PHQ-9 Total Score  0       Safety -- tobacco screening: not using -- alcohol screening:  low-risk usage. -- no evidence of domestic violence or intimate partner violence.   Cancer Screening -- pap smear not collected per ASCCP guidelines -- family history of breast cancer screening: done. not at high risk. -- Mammogram - pt would like to defer until covid pandemic is less of a risk  Immunizations -- flu vaccine declined -- TDAP q10 years up to date -- PPSV-23 (19-64 with chronic disease or smoking) given   Pt will get the johnson and johnson covid vaccine when available  Referral for eye doctor - has not been seen in 10 years Encouraged regular exercise and diet changes    Lesleigh Noe, MD

## 2019-11-30 NOTE — Patient Instructions (Signed)
Here is what I would recommend:  1) Increase the amount of water you drink a day > specifically drink a glass of water 8 oz or more before every meal 2) Could start a fiber supplement (like metamucil) > You could take this up to 3 times a day, but I would start with 1 time a day until you get used to it. It may cause some stomach upset 3) Make sure you sit down to eat and eat slowly (cut meat one piece at a time) > specifically train your body to eat only at the table (avoiding snacking in front of the TV) 4) Fill up on healthy items first > consider eating a salad with low calorie salad dressing before every meal  5) Make 1/2 of your plate vegetables 6) Keep healthy snacks available for those times when you are bored 7) Consider using a calorie counting app like MyFitnessPal > counting calories helps you make wise choices around snacking. Or if you can afford it you could sign up for Weight Watchers -- and learn about healthy options through a point system        Preventive Care 65 Years and Older, Female Preventive care refers to lifestyle choices and visits with your health care provider that can promote health and wellness. This includes:  A yearly physical exam. This is also called an annual well check.  Regular dental and eye exams.  Immunizations.  Screening for certain conditions.  Healthy lifestyle choices, such as diet and exercise. What can I expect for my preventive care visit? Physical exam Your health care provider will check:  Height and weight. These may be used to calculate body mass index (BMI), which is a measurement that tells if you are at a healthy weight.  Heart rate and blood pressure.  Your skin for abnormal spots. Counseling Your health care provider may ask you questions about:  Alcohol, tobacco, and drug use.  Emotional well-being.  Home and relationship well-being.  Sexual activity.  Eating habits.  History of falls.  Memory and ability  to understand (cognition).  Work and work Statistician.  Pregnancy and menstrual history. What immunizations do I need?  Influenza (flu) vaccine  This is recommended every year. Tetanus, diphtheria, and pertussis (Tdap) vaccine  You may need a Td booster every 10 years. Varicella (chickenpox) vaccine  You may need this vaccine if you have not already been vaccinated. Zoster (shingles) vaccine  You may need this after age 34. Pneumococcal conjugate (PCV13) vaccine  One dose is recommended after age 29. Pneumococcal polysaccharide (PPSV23) vaccine  One dose is recommended after age 56. Measles, mumps, and rubella (MMR) vaccine  You may need at least one dose of MMR if you were born in 1957 or later. You may also need a second dose. Meningococcal conjugate (MenACWY) vaccine  You may need this if you have certain conditions. Hepatitis A vaccine  You may need this if you have certain conditions or if you travel or work in places where you may be exposed to hepatitis A. Hepatitis B vaccine  You may need this if you have certain conditions or if you travel or work in places where you may be exposed to hepatitis B. Haemophilus influenzae type b (Hib) vaccine  You may need this if you have certain conditions. You may receive vaccines as individual doses or as more than one vaccine together in one shot (combination vaccines). Talk with your health care provider about the risks and benefits of combination  vaccines. What tests do I need? Blood tests  Lipid and cholesterol levels. These may be checked every 5 years, or more frequently depending on your overall health.  Hepatitis C test.  Hepatitis B test. Screening  Lung cancer screening. You may have this screening every year starting at age 45 if you have a 30-pack-year history of smoking and currently smoke or have quit within the past 15 years.  Colorectal cancer screening. All adults should have this screening starting at  age 60 and continuing until age 3. Your health care provider may recommend screening at age 30 if you are at increased risk. You will have tests every 1-10 years, depending on your results and the type of screening test.  Diabetes screening. This is done by checking your blood sugar (glucose) after you have not eaten for a while (fasting). You may have this done every 1-3 years.  Mammogram. This may be done every 1-2 years. Talk with your health care provider about how often you should have regular mammograms.  BRCA-related cancer screening. This may be done if you have a family history of breast, ovarian, tubal, or peritoneal cancers. Other tests  Sexually transmitted disease (STD) testing.  Bone density scan. This is done to screen for osteoporosis. You may have this done starting at age 51. Follow these instructions at home: Eating and drinking  Eat a diet that includes fresh fruits and vegetables, whole grains, lean protein, and low-fat dairy products. Limit your intake of foods with high amounts of sugar, saturated fats, and salt.  Take vitamin and mineral supplements as recommended by your health care provider.  Do not drink alcohol if your health care provider tells you not to drink.  If you drink alcohol: ? Limit how much you have to 0-1 drink a day. ? Be aware of how much alcohol is in your drink. In the [place] one drink equals one 12 oz bottle of beer (355 mL), one 5 oz glass of wine (148 mL), or one 1 oz glass of hard liquor (44 mL). Lifestyle  Take daily care of your teeth and gums.  Stay active. Exercise for at least 30 minutes on 5 or more days each week.  Do not use any products that contain nicotine or tobacco, such as cigarettes, e-cigarettes, and chewing tobacco. If you need help quitting, ask your health care provider.  If you are sexually active, practice safe sex. Use a condom or other form of protection in order to prevent STIs (sexually transmitted  infections).  Talk with your health care provider about taking a low-dose aspirin or statin. What's next?  Go to your health care provider once a year for a well check visit.  Ask your health care provider how often you should have your eyes and teeth checked.  Stay up to date on all vaccines. This information is not intended to replace advice given to you by your health care provider. Make sure you discuss any questions you have with your health care provider. Document Revised: 08/27/2018 Document Reviewed: 08/27/2018 Elsevier Patient Education  2020 [employer].

## 2019-12-21 ENCOUNTER — Other Ambulatory Visit: Payer: Self-pay | Admitting: General Surgery

## 2019-12-21 DIAGNOSIS — Z1231 Encounter for screening mammogram for malignant neoplasm of breast: Secondary | ICD-10-CM

## 2020-01-25 ENCOUNTER — Encounter: Payer: Self-pay | Admitting: Family Medicine

## 2020-02-11 ENCOUNTER — Other Ambulatory Visit: Payer: Self-pay | Admitting: Family Medicine

## 2020-02-11 DIAGNOSIS — E785 Hyperlipidemia, unspecified: Secondary | ICD-10-CM

## 2020-05-14 ENCOUNTER — Other Ambulatory Visit: Payer: Self-pay | Admitting: Family Medicine

## 2020-05-14 DIAGNOSIS — E785 Hyperlipidemia, unspecified: Secondary | ICD-10-CM

## 2020-08-08 ENCOUNTER — Other Ambulatory Visit: Payer: Self-pay

## 2020-08-08 ENCOUNTER — Other Ambulatory Visit: Payer: Self-pay | Admitting: Family Medicine

## 2020-08-08 ENCOUNTER — Other Ambulatory Visit: Payer: Medicare Other

## 2020-08-08 ENCOUNTER — Telehealth (INDEPENDENT_AMBULATORY_CARE_PROVIDER_SITE_OTHER): Payer: Medicare Other | Admitting: Family Medicine

## 2020-08-08 DIAGNOSIS — E785 Hyperlipidemia, unspecified: Secondary | ICD-10-CM

## 2020-08-08 DIAGNOSIS — Z20822 Contact with and (suspected) exposure to covid-19: Secondary | ICD-10-CM

## 2020-08-08 MED ORDER — BENZONATATE 100 MG PO CAPS: 100.0000 mg | ORAL_CAPSULE | Freq: Three times a day (TID) | ORAL | 0 refills | Status: DC | PRN

## 2020-08-08 MED ORDER — ONDANSETRON HCL 4 MG PO TABS: 4.0000 mg | ORAL_TABLET | Freq: Three times a day (TID) | ORAL | 0 refills | Status: DC | PRN

## 2020-08-08 MED ORDER — AZITHROMYCIN 250 MG PO TABS: ORAL_TABLET | ORAL | 0 refills | Status: DC

## 2020-08-08 NOTE — Progress Notes (Signed)
    I connected with Alexandria Mason on 08/08/20 at  9:00 AM EST by video and verified that I am speaking with the correct person using two identifiers.   I discussed the limitations, risks, security and privacy concerns of performing an evaluation and management service by video and the availability of in person appointments. I also discussed with the patient that there may be a patient responsible charge related to this service. The patient expressed understanding and agreed to proceed.  Patient location: Home Provider Location: [place] Participants: Lynnda Child and Alexandria Mason   Subjective:     Alexandria Mason is a 69 y.o. female presenting for Cough (x 3 days), Emesis (x 2 days), and Nasal Congestion     HPI  #Cough - nausea and vomiting x 2 days - coughing but feels congested and cannot cough anything up - will get tired from coughing and her ribs - has not been vaccinated to covid-19 or prior infection - no sick contact - no loss of taste or smell - no fever/chills - endorses sinus congestion - can keep ginger ale down - yesterday has not been able to eat anything - taking cold medication for cold and flu to try and break up the cough - wanted to avoid drowsy medication - trying to keep hydrated - no lightheadedness or dizziness - no sob - gets a little tired when coughing  Review of Systems   Social History   Tobacco Use  Smoking Status Never Smoker  Smokeless Tobacco Never Used        Objective:   BP Readings from Last 3 Encounters:  11/30/19 114/68  11/16/18 118/62  10/20/18 124/72   Wt Readings from Last 3 Encounters:  11/30/19 173 lb 8 oz (78.7 kg)  11/16/18 160 lb 12 oz (72.9 kg)  10/20/18 163 lb 8 oz (74.2 kg)   There were no vitals taken for this visit.   Physical Exam   Speaking in complete sentences Alert  oriented        Assessment & Plan:   Problem List Items Addressed This Visit    None    Visit  Diagnoses    Suspected 2019 novel coronavirus infection    -  Primary     Discussed OTC treatment for viral illness Patient will come today for drive-up testing Instructed to isolate until the results come back  Due to holiday weekend coming - azithromycin prescribed and advised to wait for results of covid test and if negative and symptoms persist OK to take for possible PNA  Zofran for nausea Tessalon pearls for cough   Interactive audio and video telecommunications were attempted between this provider and patient, however failed, due to patient having technical difficulties OR patient did not have access to video capability.  We continued and completed visit with audio only.   Start Time: 8:59 End Time: 9:14    No follow-ups on file.  Lynnda Child, MD

## 2020-08-10 LAB — SARS-COV-2, NAA 2 DAY TAT

## 2020-08-10 LAB — SPECIMEN STATUS REPORT

## 2020-08-10 LAB — NOVEL CORONAVIRUS, NAA: SARS-CoV-2, NAA: DETECTED — AB

## 2020-08-11 ENCOUNTER — Other Ambulatory Visit: Payer: Self-pay | Admitting: Nurse Practitioner

## 2020-08-11 DIAGNOSIS — U071 COVID-19: Secondary | ICD-10-CM

## 2020-08-11 NOTE — Progress Notes (Signed)
I connected by phone with Alexandria Mason on 08/11/2020 at 11:11 AM to discuss the potential use of a new treatment for mild to moderate COVID-19 viral infection in non-hospitalized patients.  This patient is a 69 y.o. female that meets the FDA criteria for Emergency Use Authorization of COVID monoclonal antibody casirivimab/imdevimab, bamlanivimab/eteseviamb, or sotrovimab.  Has a (+) direct SARS-CoV-2 viral test result  Has mild or moderate COVID-19   Is NOT hospitalized due to COVID-19  Is within 10 days of symptom onset  Has at least one of the high risk factor(s) for progression to severe COVID-19 and/or hospitalization as defined in EUA.  Specific high risk criteria : Older age (>/= 70 yo) and Cardiovascular disease or hypertension   I have spoken and communicated the following to the patient or parent/caregiver regarding COVID monoclonal antibody treatment:  1. FDA has authorized the emergency use for the treatment of mild to moderate COVID-19 in adults and pediatric patients with positive results of direct SARS-CoV-2 viral testing who are 65 years of age and older weighing at least 40 kg, and who are at high risk for progressing to severe COVID-19 and/or hospitalization.  2. The significant known and potential risks and benefits of COVID monoclonal antibody, and the extent to which such potential risks and benefits are unknown.  3. Information on available alternative treatments and the risks and benefits of those alternatives, including clinical trials.  4. Patients treated with COVID monoclonal antibody should continue to self-isolate and use infection control measures (e.g., wear mask, isolate, social distance, avoid sharing personal items, clean and disinfect "high touch" surfaces, and frequent handwashing) according to CDC guidelines.   5. The patient or parent/caregiver has the option to accept or refuse COVID monoclonal antibody treatment.  After reviewing this  information with the patient, the patient has agreed to receive one of the available covid 19 monoclonal antibodies and will be provided an appropriate fact sheet prior to infusion. Alexandria Reel, NP 08/11/2020 11:11 AM

## 2020-08-12 ENCOUNTER — Ambulatory Visit (HOSPITAL_COMMUNITY)
Admission: RE | Admit: 2020-08-12 | Discharge: 2020-08-12 | Disposition: A | Discharge status: Home / Self Care | Payer: Medicare Other | Source: Ambulatory Visit | Attending: Pulmonary Disease | Admitting: Pulmonary Disease

## 2020-08-12 DIAGNOSIS — U071 COVID-19: Secondary | ICD-10-CM | POA: Insufficient documentation

## 2020-08-12 DIAGNOSIS — Z23 Encounter for immunization: Secondary | ICD-10-CM | POA: Insufficient documentation

## 2020-08-12 MED ORDER — SOTROVIMAB 500 MG/8ML IV SOLN
500.0000 mg | Freq: Once | INTRAVENOUS | Status: AC
  Administered 2020-08-12: 500 mg via INTRAVENOUS

## 2020-08-12 MED ORDER — SODIUM CHLORIDE 0.9 % IV SOLN: INTRAVENOUS | Status: DC | PRN

## 2020-08-12 MED ORDER — FAMOTIDINE IN NACL 20-0.9 MG/50ML-% IV SOLN: 20.0000 mg | Freq: Once | INTRAVENOUS | Status: DC | PRN

## 2020-08-12 MED ORDER — DIPHENHYDRAMINE HCL 50 MG/ML IJ SOLN: 50.0000 mg | Freq: Once | INTRAMUSCULAR | Status: DC | PRN

## 2020-08-12 MED ORDER — ALBUTEROL SULFATE HFA 108 (90 BASE) MCG/ACT IN AERS: 2.0000 | INHALATION_SPRAY | Freq: Once | RESPIRATORY_TRACT | Status: DC | PRN

## 2020-08-12 MED ORDER — METHYLPREDNISOLONE SODIUM SUCC 125 MG IJ SOLR: 125.0000 mg | Freq: Once | INTRAMUSCULAR | Status: DC | PRN

## 2020-08-12 MED ORDER — EPINEPHRINE 0.3 MG/0.3ML IJ SOAJ: 0.3000 mg | Freq: Once | INTRAMUSCULAR | Status: DC | PRN

## 2020-08-12 NOTE — Progress Notes (Signed)
Patient reviewed Fact Sheet for Patients, Parents, and Caregivers for Emergency Use Authorization (EUA) of Sotrovimab for the Treatment of Coronavirus. Patient also reviewed and is agreeable to the estimated cost of treatment. Patient is agreeable to proceed.   

## 2020-08-12 NOTE — Progress Notes (Signed)
Diagnosis: COVID-19  Physician: Dr. Patrick Wright  Procedure: Covid Infusion Clinic Med: Sotrovimab infusion - Provided patient with sotrovimab fact sheet for patients, parents, and caregivers prior to infusion.   Complications: No immediate complications noted  Discharge: Discharged home    

## 2020-08-12 NOTE — Discharge Instructions (Signed)

## 2020-08-16 ENCOUNTER — Emergency Department
Admission: EM | Admit: 2020-08-16 | Discharge: 2020-08-17 | Disposition: A | Discharge status: Home / Self Care | Payer: Medicare Other | Source: Home / Self Care | Attending: Emergency Medicine | Admitting: Emergency Medicine

## 2020-08-16 ENCOUNTER — Emergency Department: Payer: Medicare Other

## 2020-08-16 ENCOUNTER — Other Ambulatory Visit: Payer: Self-pay

## 2020-08-16 DIAGNOSIS — Z79899 Other long term (current) drug therapy: Secondary | ICD-10-CM | POA: Insufficient documentation

## 2020-08-16 DIAGNOSIS — E860 Dehydration: Secondary | ICD-10-CM

## 2020-08-16 DIAGNOSIS — E876 Hypokalemia: Secondary | ICD-10-CM | POA: Insufficient documentation

## 2020-08-16 DIAGNOSIS — U071 COVID-19: Secondary | ICD-10-CM | POA: Insufficient documentation

## 2020-08-16 LAB — BASIC METABOLIC PANEL
Anion gap: 13 (ref 5–15)
BUN: 14 mg/dL (ref 8–23)
CO2: 25 mmol/L (ref 22–32)
Calcium: 8.5 mg/dL — ABNORMAL LOW (ref 8.9–10.3)
Chloride: 100 mmol/L (ref 98–111)
Creatinine, Ser: 0.88 mg/dL (ref 0.44–1.00)
GFR, Estimated: >60 mL/min (ref >60)
Glucose, Bld: 181 mg/dL — ABNORMAL HIGH (ref 70–99)
Potassium: 3.1 mmol/L — ABNORMAL LOW (ref 3.5–5.1)
Sodium: 138 mmol/L (ref 135–145)

## 2020-08-16 LAB — CBC
HCT: 40.1 % (ref 36.0–46.0)
Hemoglobin: 13.4 g/dL (ref 12.0–15.0)
MCH: 29.9 pg (ref 26.0–34.0)
MCHC: 33.4 g/dL (ref 30.0–36.0)
MCV: 89.5 fL (ref 80.0–100.0)
Platelets: 371 10*3/uL (ref 150–400)
RBC: 4.48 MIL/uL (ref 3.87–5.11)
RDW: 11.9 % (ref 11.5–15.5)
WBC: 10.7 10*3/uL — ABNORMAL HIGH (ref 4.0–10.5)
nRBC: 0 % (ref 0.0–0.2)

## 2020-08-16 LAB — LACTIC ACID, PLASMA: Lactic Acid, Venous: 2.3 mmol/L (ref 0.5–1.9)

## 2020-08-16 MED ORDER — SODIUM CHLORIDE 0.9 % IV BOLUS
1000.0000 mL | Freq: Once | INTRAVENOUS | Status: AC
  Administered 2020-08-16: 1000 mL via INTRAVENOUS

## 2020-08-16 MED ORDER — LACTATED RINGERS IV BOLUS
500.0000 mL | Freq: Once | INTRAVENOUS | Status: AC
  Administered 2020-08-16: 500 mL via INTRAVENOUS

## 2020-08-16 MED ORDER — ONDANSETRON HCL 4 MG/2ML IJ SOLN
4.0000 mg | Freq: Once | INTRAMUSCULAR | Status: AC
  Administered 2020-08-16: 4 mg via INTRAVENOUS
  Filled 2020-08-16: qty 2

## 2020-08-16 NOTE — ED Triage Notes (Signed)
Pt positive for covid on Thursday. Pt reports getting antibody infusion on Saturday at [facility]. Pt reports weakness, nausea, vomiting, and mild shortness of breath. Pt states she has been able to tolerate food and drink as normal.

## 2020-08-16 NOTE — ED Notes (Signed)
Date and time results received: 08/16/20 11:32 PM (use smartphrase ".now" to insert current time)  Test: lactic acid  Critical Value: 2.3  Name of Provider Notified: ED Provider made aware.   Orders Received? Or Actions Taken?: Actions Taken: Provider aware.

## 2020-08-16 NOTE — ED Provider Notes (Signed)
[facility] Emergency Department Provider Note  ____________________________________________   First MD Initiated Contact with Patient 08/16/20 2342     (approximate)  I have reviewed the triage vital signs and the nursing notes.   HISTORY  Chief Complaint Shortness of Breath, Covid Positive, and Emesis   HPI Alexandria Mason is a 69 y.o. female with a past medical history of HDL and anemia who presents for assessment of 7 to 8 days of shortness of breath and nausea as well as severe fatigue and decreased p.o. intake.  This is in the setting of recent being diagnosed with COVID-19 on 11/23.  Patient also underwent outpatient medical antibiotic fusion on 11/27.  He states he is coming emergency room because she feels persistently short of breath and fatigued with does not seem her symptoms acutely changed in last 24 hours.  States she initially had some diarrhea but this is slowed down.  Today she has been drinking fluids including ginger ale and Gatorade.  She denies any pain including headache, earache, sore throat, chest pain abdominal pain, back pain, rash or extremity pain, burning with urination or any blood in her stool or urine.  No other clear alleviating or aggravating factors and patient is not taking any medications at home.  She denies any lightheadedness dizziness or presyncopal symptoms.  She is not a smoker.         History reviewed. No pertinent past medical history.  Patient Active Problem List   Diagnosis Date Noted  . Hyperlipidemia 08/08/2020  . Anxiety 10/20/2018    Past Surgical History:  Procedure Laterality Date  . BUNIONECTOMY     x 2-bilateral feet- different years for each one  . CATARACT EXTRACTION Left 2011  . oral surgeries    . WRIST FRACTURE SURGERY Right 08/2006   metal plate present    Prior to Admission medications   Medication Sig Start Date End Date Taking? Authorizing Provider  atorvastatin (LIPITOR) 10 MG  tablet TAKE 1 TABLET BY MOUTH EVERY DAY 08/08/20   Lynnda Child, MD  azithromycin (ZITHROMAX) 250 MG tablet Take 2 tablets (500 mg) today. Then take 1 tablet daily for the next 4 days. 08/08/20   Lynnda Child, MD  benzonatate (TESSALON PERLES) 100 MG capsule Take 1 capsule (100 mg total) by mouth 3 (three) times daily as needed for cough. 08/08/20   Lynnda Child, MD  Naproxen Sodium (ALEVE) 220 MG CAPS Take by mouth daily as needed.    [provider]  ondansetron (ZOFRAN) 4 MG tablet Take 1 tablet (4 mg total) by mouth every 8 (eight) hours as needed for up to 10 doses for nausea or vomiting. 08/17/20   Gilles Chiquito, MD    Allergies Oxycodone-acetaminophen and Sulfa antibiotics  Family History  Problem Relation Age of Onset  . Congestive Heart Failure Mother   . Heart disease Father   . Heart attack Father 58  . Heart disease Brother   . Heart attack Paternal Grandfather     Social History Social History   Tobacco Use  . Smoking status: Never Smoker  . Smokeless tobacco: Never Used  Vaping Use  . Vaping Use: Never used  Substance Use Topics  . Alcohol use: Not Currently    Comment: rarely  . Drug use: Never    Review of Systems  Review of Systems  Constitutional: Positive for chills and malaise/fatigue. Negative for fever.  HENT: Negative for sore throat.   Eyes: Negative  for pain.  Respiratory: Positive for shortness of breath. Negative for cough and stridor.   Cardiovascular: Negative for chest pain.  Gastrointestinal: Positive for nausea. Negative for vomiting.  Genitourinary: Negative for dysuria.  Musculoskeletal: Negative for myalgias.  Skin: Negative for rash.  Neurological: Negative for seizures, loss of consciousness and headaches.  Psychiatric/Behavioral: Negative for suicidal ideas.  All other systems reviewed and are negative.     ____________________________________________   PHYSICAL EXAM:  VITAL SIGNS: ED Triage Vitals   Enc Vitals Group     BP 08/16/20 2218 (!) 100/57     Pulse Rate 08/16/20 2218 71     Resp 08/16/20 2218 (!) 22     Temp 08/16/20 2218 (!) 96.7 F (35.9 C)     Temp Source 08/16/20 2218 Axillary     SpO2 08/16/20 2216 98 %     Weight 08/16/20 2219 160 lb (72.6 kg)     Height 08/16/20 2219 5\' 2"  (1.575 m)     Head Circumference --      Peak Flow --      Pain Score 08/16/20 2219 0     Pain Loc --      Pain Edu? --      Excl. in GC? --    Vitals:   08/17/20 0000 08/17/20 0030  BP: 115/60 117/63  Pulse: 74 67  Resp: (!) 27 20  Temp:    SpO2:  95%   Physical Exam Vitals and nursing note reviewed.  Constitutional:      General: She is not in acute distress.    Appearance: She is well-developed.  HENT:     Head: Normocephalic and atraumatic.  Eyes:     Conjunctiva/sclera: Conjunctivae normal.  Cardiovascular:     Rate and Rhythm: Normal rate and regular rhythm.     Heart sounds: No murmur heard.   Pulmonary:     Effort: Pulmonary effort is normal. Tachypnea present. No respiratory distress.     Breath sounds: Examination of the right-lower field reveals rhonchi. Examination of the left-lower field reveals rhonchi. Rhonchi present.  Abdominal:     Palpations: Abdomen is soft.     Tenderness: There is no abdominal tenderness.  Musculoskeletal:     Cervical back: Neck supple.  Skin:    General: Skin is warm and dry.     Capillary Refill: Capillary refill takes 2 to 3 seconds.  Neurological:     General: No focal deficit present.     Mental Status: She is alert.  Psychiatric:        Mood and Affect: Mood normal.      ____________________________________________   LABS (all labs ordered are listed, but only abnormal results are displayed)  Labs Reviewed  BASIC METABOLIC PANEL - Abnormal; Notable for the following components:      Result Value   Potassium 3.1 (*)    Glucose, Bld 181 (*)    Calcium 8.5 (*)    All other components within normal limits  CBC -  Abnormal; Notable for the following components:   WBC 10.7 (*)    All other components within normal limits  LACTIC ACID, PLASMA - Abnormal; Notable for the following components:   Lactic Acid, Venous 2.3 (*)    All other components within normal limits  LACTIC ACID, PLASMA  BRAIN NATRIURETIC PEPTIDE  MAGNESIUM  URINALYSIS, COMPLETE (UACMP) WITH MICROSCOPIC  TROPONIN I (HIGH SENSITIVITY)   ____________________________________________  EKG  Sinus rhythm with ventricular to 67, normal axis,  prolonged QTc interval at 500, nonspecific changes in V1 and lead III without any other clear evidence of acute ischemia or other significant along with anterior ____________________________________________  RADIOLOGY  ED MD interpretation: Multifocal pneumonia without overt edema, large effusion cardiomediastinum, pneumothorax, or other acute intrathoracic process.  Official radiology report(s): DG Chest Portable 1 View  Result Date: 08/16/2020 CLINICAL DATA:  COVID-19 positive, weakness, nausea and vomiting EXAM: PORTABLE CHEST 1 VIEW COMPARISON:  None. FINDINGS: Single frontal view of the chest demonstrates an unremarkable cardiac silhouette. Diffuse interstitial prominence with scattered bilateral areas of airspace disease. No effusion or pneumothorax. No acute bony abnormality. IMPRESSION: 1. Findings consistent with multifocal COVID-19 pneumonia. Electronically Signed   By: Sharlet Salina M.D.   On: 08/16/2020 23:45    ____________________________________________   PROCEDURES  Procedure(s) performed (including Critical Care):  .1-3 Lead EKG Interpretation Performed by: Gilles Chiquito, MD Authorized by: Gilles Chiquito, MD     Interpretation: normal     ECG rate assessment: normal     Rhythm: sinus rhythm     Ectopy: none     Conduction: normal       ____________________________________________   INITIAL IMPRESSION / ASSESSMENT AND PLAN / ED COURSE      Patient  presents with left history exam for assessment of persistent shortness of breath and nausea since she was recently diagnosed with COVID-19.  On arrival she is tachypneic with otherwise stable vital signs on room air.  She is not hypoxic or altered her abdomen is soft and nontender throughout.  She does have slightly dry membranes and decreased cap refill.  ED remarkable for hypokalemia with a K of 3.1 and mild hyperglycemia with a glucose of 181 with no evidence of acidosis other significant electrolyte or metabolic derangements.  I suspect his glycemia is a recent stress hyperglycemia in the setting of Covid illness.  Patient did have an A1c obtained last year that was normal.  However this can be followed up by her PCP once she recovers from her current illness.  CBC shows mild leukocytosis with WBC count of 10.7 but no evidence of acute anemia or other significant derangements.  Initial lactic acid of 2.3 is consistent with dehydration.  This did decrease to 1.3 after IV fluids.  BMP and magnesium are within normal limits and overall very low suspicion for acute volume overload contributing to her shortness of breath.  Troponin is 5 and given otherwise relatively reassuring EKG low suspicion for ACS or myocarditis at this time.  Overall presentation is consistent with expected clinical course of COVID-19.  Given patient is not hypoxic and on reassessment her tachypnea improved little bit to 20 is otherwise stable vital signs on room air patient tolerating p.o. otherwise reassuring exam and work-up I believe she is safe for discharge with plan for close outpatient PCP follow-up.  Patient stable condition for strict precautions advised and discussed       ____________________________________________   FINAL CLINICAL IMPRESSION(S) / ED DIAGNOSES  Final diagnoses:  COVID-19  Hypokalemia  Dehydration    Medications  potassium chloride SA (KLOR-CON) CR tablet 40 mEq (has no administration in time  range)  sodium chloride 0.9 % bolus 1,000 mL (0 mLs Intravenous Stopped 08/16/20 2358)  ondansetron (ZOFRAN) injection 4 mg (4 mg Intravenous Given 08/16/20 2251)  lactated ringers bolus 500 mL (0 mLs Intravenous Stopped 08/17/20 0102)     ED Discharge Orders         Ordered  ondansetron (ZOFRAN) 4 MG tablet  Every 8 hours PRN        08/17/20 0104           Note:  This document was prepared using Dragon voice recognition software and may include unintentional dictation errors.   Gilles ChiquitoSmith, Yue Flanigan P, MD 08/17/20 [phone]

## 2020-08-17 LAB — BRAIN NATRIURETIC PEPTIDE: B Natriuretic Peptide: 56.2 pg/mL (ref 0.0–100.0)

## 2020-08-17 LAB — MAGNESIUM: Magnesium: 2.2 mg/dL (ref 1.7–2.4)

## 2020-08-17 LAB — TROPONIN I (HIGH SENSITIVITY): Troponin I (High Sensitivity): 5 ng/L (ref <18)

## 2020-08-17 LAB — LACTIC ACID, PLASMA: Lactic Acid, Venous: 1.3 mmol/L (ref 0.5–1.9)

## 2020-08-17 MED ORDER — POTASSIUM CHLORIDE 20 MEQ PO PACK
40.0000 meq | PACK | Freq: Once | ORAL | Status: AC
  Administered 2020-08-17: 40 meq via ORAL
  Filled 2020-08-17: qty 2

## 2020-08-17 MED ORDER — ONDANSETRON HCL 4 MG PO TABS: 4.0000 mg | ORAL_TABLET | Freq: Three times a day (TID) | ORAL | 0 refills | Status: DC | PRN

## 2020-08-17 MED ORDER — POTASSIUM CHLORIDE CRYS ER 20 MEQ PO TBCR
40.0000 meq | EXTENDED_RELEASE_TABLET | Freq: Once | ORAL | Status: DC
  Filled 2020-08-17: qty 2

## 2020-08-18 ENCOUNTER — Emergency Department: Payer: Medicare Other

## 2020-08-18 ENCOUNTER — Other Ambulatory Visit: Payer: Self-pay

## 2020-08-18 ENCOUNTER — Encounter: Payer: Self-pay | Admitting: Emergency Medicine

## 2020-08-18 ENCOUNTER — Inpatient Hospital Stay
Admission: EM | Admit: 2020-08-18 | Discharge: 2020-08-24 | DRG: 177 | Disposition: A | Discharge status: Home Health | Payer: Medicare Other | Attending: Hospitalist | Admitting: Hospitalist

## 2020-08-18 DIAGNOSIS — J9601 Acute respiratory failure with hypoxia: Secondary | ICD-10-CM | POA: Diagnosis present

## 2020-08-18 DIAGNOSIS — Z9842 Cataract extraction status, left eye: Secondary | ICD-10-CM | POA: Diagnosis not present

## 2020-08-18 DIAGNOSIS — U071 COVID-19: Secondary | ICD-10-CM | POA: Diagnosis present

## 2020-08-18 DIAGNOSIS — E876 Hypokalemia: Secondary | ICD-10-CM | POA: Diagnosis present

## 2020-08-18 DIAGNOSIS — A0839 Other viral enteritis: Secondary | ICD-10-CM

## 2020-08-18 DIAGNOSIS — J1282 Pneumonia due to coronavirus disease 2019: Secondary | ICD-10-CM | POA: Diagnosis present

## 2020-08-18 DIAGNOSIS — E860 Dehydration: Secondary | ICD-10-CM | POA: Diagnosis present

## 2020-08-18 DIAGNOSIS — F419 Anxiety disorder, unspecified: Secondary | ICD-10-CM | POA: Diagnosis present

## 2020-08-18 DIAGNOSIS — Z882 Allergy status to sulfonamides status: Secondary | ICD-10-CM

## 2020-08-18 DIAGNOSIS — Z79899 Other long term (current) drug therapy: Secondary | ICD-10-CM

## 2020-08-18 DIAGNOSIS — Z8249 Family history of ischemic heart disease and other diseases of the circulatory system: Secondary | ICD-10-CM | POA: Diagnosis not present

## 2020-08-18 DIAGNOSIS — R739 Hyperglycemia, unspecified: Secondary | ICD-10-CM | POA: Diagnosis present

## 2020-08-18 DIAGNOSIS — K5900 Constipation, unspecified: Secondary | ICD-10-CM | POA: Diagnosis not present

## 2020-08-18 DIAGNOSIS — Z885 Allergy status to narcotic agent status: Secondary | ICD-10-CM | POA: Diagnosis not present

## 2020-08-18 DIAGNOSIS — E785 Hyperlipidemia, unspecified: Secondary | ICD-10-CM | POA: Diagnosis present

## 2020-08-18 HISTORY — DX: Acute respiratory failure with hypoxia: J96.01

## 2020-08-18 HISTORY — DX: Other viral enteritis: A08.39

## 2020-08-18 HISTORY — DX: COVID-19: U07.1

## 2020-08-18 LAB — COMPREHENSIVE METABOLIC PANEL
ALT: 15 U/L (ref 0–44)
AST: 23 U/L (ref 15–41)
Albumin: 3.3 g/dL — ABNORMAL LOW (ref 3.5–5.0)
Alkaline Phosphatase: 145 U/L — ABNORMAL HIGH (ref 38–126)
Anion gap: 11 (ref 5–15)
BUN: 14 mg/dL (ref 8–23)
CO2: 26 mmol/L (ref 22–32)
Calcium: 8.9 mg/dL (ref 8.9–10.3)
Chloride: 102 mmol/L (ref 98–111)
Creatinine, Ser: 0.79 mg/dL (ref 0.44–1.00)
GFR, Estimated: >60 mL/min (ref >60)
Glucose, Bld: 128 mg/dL — ABNORMAL HIGH (ref 70–99)
Potassium: 3.2 mmol/L — ABNORMAL LOW (ref 3.5–5.1)
Sodium: 139 mmol/L (ref 135–145)
Total Bilirubin: 1.6 mg/dL — ABNORMAL HIGH (ref 0.3–1.2)
Total Protein: 6.6 g/dL (ref 6.5–8.1)

## 2020-08-18 LAB — CBC WITH DIFFERENTIAL/PLATELET
Abs Immature Granulocytes: 0.07 10*3/uL (ref 0.00–0.07)
Basophils Absolute: 0.1 10*3/uL (ref 0.0–0.1)
Basophils Relative: 1 %
Eosinophils Absolute: 0.1 10*3/uL (ref 0.0–0.5)
Eosinophils Relative: 1 %
HCT: 39 % (ref 36.0–46.0)
Hemoglobin: 13.2 g/dL (ref 12.0–15.0)
Immature Granulocytes: 1 %
Lymphocytes Relative: 13 %
Lymphs Abs: 1.3 10*3/uL (ref 0.7–4.0)
MCH: 30.1 pg (ref 26.0–34.0)
MCHC: 33.8 g/dL (ref 30.0–36.0)
MCV: 89 fL (ref 80.0–100.0)
Monocytes Absolute: 0.8 10*3/uL (ref 0.1–1.0)
Monocytes Relative: 8 %
Neutro Abs: 7.9 10*3/uL — ABNORMAL HIGH (ref 1.7–7.7)
Neutrophils Relative %: 76 %
Platelets: 420 10*3/uL — ABNORMAL HIGH (ref 150–400)
RBC: 4.38 MIL/uL (ref 3.87–5.11)
RDW: 12 % (ref 11.5–15.5)
WBC: 10.2 10*3/uL (ref 4.0–10.5)
nRBC: 0 % (ref 0.0–0.2)

## 2020-08-18 LAB — URINALYSIS, COMPLETE (UACMP) WITH MICROSCOPIC
Bacteria, UA: NONE SEEN
Bilirubin Urine: NEGATIVE
Glucose, UA: NEGATIVE mg/dL
Hgb urine dipstick: NEGATIVE
Ketones, ur: 5 mg/dL — AB
Nitrite: NEGATIVE
Protein, ur: NEGATIVE mg/dL
Specific Gravity, Urine: 1.017 (ref 1.005–1.030)
pH: 6 (ref 5.0–8.0)

## 2020-08-18 LAB — LIPASE, BLOOD: Lipase: 33 U/L (ref 11–51)

## 2020-08-18 MED ORDER — LACTATED RINGERS IV SOLN: INTRAVENOUS | Status: AC

## 2020-08-18 MED ORDER — SODIUM CHLORIDE 0.9 % IV SOLN
1000.0000 mL | Freq: Once | INTRAVENOUS | Status: AC
  Administered 2020-08-18: 1000 mL via INTRAVENOUS

## 2020-08-18 MED ORDER — ACETAMINOPHEN 325 MG PO TABS: 325.0000 mg | ORAL_TABLET | Freq: Four times a day (QID) | ORAL | Status: DC | PRN

## 2020-08-18 MED ORDER — IBUPROFEN 100 MG/5ML PO SUSP
400.0000 mg | Freq: Four times a day (QID) | ORAL | Status: DC | PRN
  Filled 2020-08-18: qty 20

## 2020-08-18 MED ORDER — METHYLPREDNISOLONE SODIUM SUCC 125 MG IJ SOLR: 1.0000 mg/kg | Freq: Two times a day (BID) | INTRAMUSCULAR | Status: DC

## 2020-08-18 MED ORDER — GUAIFENESIN-DM 100-10 MG/5ML PO SYRP: 10.0000 mL | ORAL_SOLUTION | Freq: Three times a day (TID) | ORAL | Status: DC | PRN

## 2020-08-18 MED ORDER — ENOXAPARIN SODIUM 40 MG/0.4ML ~~LOC~~ SOLN
40.0000 mg | SUBCUTANEOUS | Status: DC
  Administered 2020-08-18 – 2020-08-23 (×6): 40 mg via SUBCUTANEOUS
  Filled 2020-08-18 (×6): qty 0.4

## 2020-08-18 MED ORDER — PREDNISONE 50 MG PO TABS
50.0000 mg | ORAL_TABLET | Freq: Every day | ORAL | Status: DC
  Administered 2020-08-22: 09:00:00 50 mg via ORAL
  Filled 2020-08-18: qty 1

## 2020-08-18 MED ORDER — BENZONATATE 100 MG PO CAPS: 100.0000 mg | ORAL_CAPSULE | Freq: Three times a day (TID) | ORAL | Status: DC | PRN

## 2020-08-18 MED ORDER — PROMETHAZINE HCL 25 MG/ML IJ SOLN
12.5000 mg | INTRAMUSCULAR | Status: DC | PRN
  Administered 2020-08-18: 12.5 mg via INTRAVENOUS
  Filled 2020-08-18: qty 1

## 2020-08-18 MED ORDER — METHYLPREDNISOLONE SODIUM SUCC 125 MG IJ SOLR
1.0000 mg/kg | Freq: Two times a day (BID) | INTRAMUSCULAR | Status: AC
  Administered 2020-08-19 – 2020-08-21 (×6): 72.5 mg via INTRAVENOUS
  Filled 2020-08-18 (×6): qty 2

## 2020-08-18 MED ORDER — ATORVASTATIN CALCIUM 20 MG PO TABS
10.0000 mg | ORAL_TABLET | Freq: Every day | ORAL | Status: DC
  Administered 2020-08-20 – 2020-08-23 (×4): 10 mg via ORAL
  Filled 2020-08-18 (×6): qty 1

## 2020-08-18 MED ORDER — PREDNISONE 20 MG PO TABS: 50.0000 mg | ORAL_TABLET | Freq: Every day | ORAL | Status: DC

## 2020-08-18 MED ORDER — POTASSIUM CITRATE-CITRIC ACID 1100-334 MG/5ML PO SOLN
40.0000 meq | Freq: Once | ORAL | Status: DC
  Filled 2020-08-18: qty 20

## 2020-08-18 MED ORDER — ONDANSETRON HCL 4 MG PO TABS: 4.0000 mg | ORAL_TABLET | Freq: Four times a day (QID) | ORAL | Status: AC | PRN

## 2020-08-18 MED ORDER — DEXAMETHASONE SODIUM PHOSPHATE 10 MG/ML IJ SOLN
8.0000 mg | Freq: Once | INTRAMUSCULAR | Status: AC
  Administered 2020-08-18: 8 mg via INTRAVENOUS
  Filled 2020-08-18: qty 1

## 2020-08-18 MED ORDER — ONDANSETRON HCL 4 MG/2ML IJ SOLN
4.0000 mg | Freq: Four times a day (QID) | INTRAMUSCULAR | Status: AC | PRN
  Administered 2020-08-18 – 2020-08-19 (×2): 4 mg via INTRAVENOUS
  Filled 2020-08-18 (×3): qty 2

## 2020-08-18 MED ORDER — ONDANSETRON HCL 4 MG/2ML IJ SOLN
4.0000 mg | Freq: Once | INTRAMUSCULAR | Status: AC
  Administered 2020-08-18: 4 mg via INTRAVENOUS
  Filled 2020-08-18: qty 2

## 2020-08-18 NOTE — H&P (Signed)
History and Physical   Alexandria Mason:[id number] DOB: 1950/12/17 DOA: 08/18/2020  PCP: Alexandria Child, MD  Outpatient Specialists: None Patient coming from: home  I have personally briefly reviewed patient's old medical records in [facility] EMR.  Chief Concern: nausea and vomiting  HPI: Alexandria Mason is a 69 y.o. female with medical history significant for hyperlipidemia presented to the ED for chief concern of nausea, vomiting, diarrhea that started about 10 days ago.   She was tested positive for covid on 08/08/20.  She is unvaccinated for COVID-19 infection.  Patient reports that she has tried to maintain hydration with p.o. intake of water however she is not able to keep anything down.  She endorses monoclonal antibody infusion on 08/12/2020 without improvement of symptoms.  ROS was negative for headache, vision changes, chest pain, shortness of breath, fever, blood in stool, blood in vomit, dysuria, hematuria. She reports her ability to taste and smell are intact. She denies weakness in her legs and swelling of her lower extremities. She denies dysphagia and odynophagia.  ROS was positive for productive cough with green mucus, abdominal pain, nausea, and vomiting. She endorses feeling her hearing is blocked and insomnia. Endorses reduced appetite.   Update: Alexandria Mason is status pose two doses of IV ondansetron and remains nauseous and vomiting.  ED Course: discussed with ED provider. Admitting for generalized weakness, intractable nausea and vomiting.  Review of Systems: As per HPI otherwise 10 point review of systems negative.  Assessment/Plan  Principal Problem:   Acute hypoxemic respiratory failure due to COVID-19 [facility]) Active Problems:   Anxiety   HLD (hyperlipidemia)   Gastroenteritis due to COVID-19 virus   Hypokalemia   Acute hypoxic respiratory failure secondary to COVID-19 infection -Patient is unvaccinated for COVID-19 -Patient is outside the window to  benefit from antiviral -Status post dexamethasone 8 mg IV in the ED -Continue with steroid IV via Solu-Medrol initiating on 08/19/2020 -Supplemental oxygen provided to maintain SPO2 greater than 92% for patient comfort  Gastroenteritis secondary to COVID-19 infection-ondansetron as needed for nausea and vomiting -Symptomatic management: Status post normal saline 1 L bolus -LR IVF at 125 cc per hour for 8 hours to complete 1 L bag -Phenergan 12.5 mg IV every 4 hours for refractory nausea and vomiting for 3 doses placed  Hypokalemia-secondary to GI loss, replace with oral solution, BMP in a.m.  Hyperlipidemia-resumed home atorvastatin 10 mg daily nightly Chart reviewed.   As needed medications: Tessalon Perles, guaifenesin-dextromethorphan, ondansetron, ibuprofen solution  DVT prophylaxis: Enoxaparin Code Status: Full Diet: Heart healthy diet Family Communication: she updated daughter at bedside Disposition Plan: Pending clinical course Consults called: Not indicated at this time Admission status: Inpatient to medical floor as patient is requiring O2 supplementation and experiencing refractory nausea and vomiting  History reviewed. No pertinent past medical history.  Past Surgical History:  Procedure Laterality Date  . BUNIONECTOMY     x 2-bilateral feet- different years for each one  . CATARACT EXTRACTION Left 2011  . oral surgeries    . WRIST FRACTURE SURGERY Right 08/2006   metal plate present   Social History:  reports that she has never smoked. She has never used smokeless tobacco. She reports previous alcohol use. She reports that she does not use drugs.  Allergies  Allergen Reactions  . Oxycodone-Acetaminophen     Hallucinations  . Sulfa Antibiotics     Upset stomach   Family History  Problem Relation Age of Onset  . Congestive Heart Failure Mother   .  Heart disease Father   . Heart attack Father 67  . Heart disease Brother   . Heart attack Paternal Grandfather     Family history: Family history reviewed and not pertinent  Prior to Admission medications   Medication Sig Start Date End Date Taking? Authorizing Provider  atorvastatin (LIPITOR) 10 MG tablet TAKE 1 TABLET BY MOUTH EVERY DAY 08/08/20   Alexandria Child, MD  azithromycin (ZITHROMAX) 250 MG tablet Take 2 tablets (500 mg) today. Then take 1 tablet daily for the next 4 days. 08/08/20   Alexandria Child, MD  benzonatate (TESSALON PERLES) 100 MG capsule Take 1 capsule (100 mg total) by mouth 3 (three) times daily as needed for cough. 08/08/20   Alexandria Child, MD  Naproxen Sodium (ALEVE) 220 MG CAPS Take by mouth daily as needed.    [provider]  ondansetron (ZOFRAN) 4 MG tablet Take 1 tablet (4 mg total) by mouth every 8 (eight) hours as needed for up to 10 doses for nausea or vomiting. 08/17/20   Gilles Chiquito, MD   Physical Exam: Vitals:   08/18/20 1126 08/18/20 1200 08/18/20 1230 08/18/20 1344  BP: 132/60 134/73 115/73 121/61  Pulse: 72 67 68 64  Resp: 18 18 20 20   Temp: 98.5 F (36.9 C)     TempSrc: Oral     SpO2: 94% 94% 96% 96%  Weight:      Height:       Constitutional: appears age-appropriate, NAD, calm, comfortable Eyes: PERRL left eye, lids and conjunctivae normal. Right eye does not dilate due to injury and this is baseline for patient. ENMT: Mucous membranes are moist. Posterior pharynx clear of any exudate or lesions. Age-appropriate dentition. Hearing appropriate Neck: normal, supple, no masses, no thyromegaly Respiratory: clear to auscultation bilaterally, no wheezing, no crackles. Normal respiratory effort. No accessory muscle use.  Cardiovascular: Regular rate and rhythm, no murmurs / rubs / gallops. No extremity edema. 2+ pedal pulses. No carotid bruits.  Abdomen: no tenderness, no masses palpated, no hepatosplenomegaly. Bowel sounds positive.  Musculoskeletal: no clubbing / cyanosis. No joint deformity upper and lower extremities. Good ROM, no  contractures, no atrophy. Normal muscle tone.  Skin: no rashes, lesions, ulcers. No induration Neurologic: Sensation intact. Strength 5/5 in all 4.  Psychiatric: Normal judgment and insight. Alert and oriented x 3. Normal mood.   EKG: Independently reviewed, showing normal sinus rate of 67, QTc 500  Chest x-ray on Admission: Personally reviewed and I agree with radiologist reading as below.  DG Chest Port 1 View  Result Date: 08/18/2020 CLINICAL DATA:  COVID-19 positive, nausea and vomiting EXAM: PORTABLE CHEST 1 VIEW COMPARISON:  08/16/2020 chest radiograph. FINDINGS: Stable cardiomediastinal silhouette with top-normal heart size. No pneumothorax. No pleural effusion. Moderate patchy opacities throughout both lungs, predominantly peripheral, stable to slightly worsened. IMPRESSION: Moderate patchy opacities throughout both lungs, stable to slightly worsened, compatible with COVID-19 pneumonia. Electronically Signed   By: 14/09/2019 M.D.   On: 08/18/2020 12:49   DG Chest Portable 1 View  Result Date: 08/16/2020 CLINICAL DATA:  COVID-19 positive, weakness, nausea and vomiting EXAM: PORTABLE CHEST 1 VIEW COMPARISON:  None. FINDINGS: Single frontal view of the chest demonstrates an unremarkable cardiac silhouette. Diffuse interstitial prominence with scattered bilateral areas of airspace disease. No effusion or pneumothorax. No acute bony abnormality. IMPRESSION: 1. Findings consistent with multifocal COVID-19 pneumonia. Electronically Signed   By: 14/09/2019 M.D.   On: 08/16/2020 23:45   Labs on Admission:  I have personally reviewed following labs  CBC: Recent Labs  Lab 08/16/20 2238 08/18/20 0456  WBC 10.7* 10.2  NEUTROABS  --  7.9*  HGB 13.4 13.2  HCT 40.1 39.0  MCV 89.5 89.0  PLT 371 420*   Basic Metabolic Panel: Recent Labs  Lab 08/16/20 2238 08/18/20 0456  NA 138 139  K 3.1* 3.2*  CL 100 102  CO2 25 26  GLUCOSE 181* 128*  BUN 14 14  CREATININE 0.88 0.79  CALCIUM  8.5* 8.9  MG 2.2  --    GFR: Estimated Creatinine Clearance: 61 mL/min (by C-G formula based on SCr of 0.79 mg/dL). Liver Function Tests: Recent Labs  Lab 08/18/20 0456  AST 23  ALT 15  ALKPHOS 145*  BILITOT 1.6*  PROT 6.6  ALBUMIN 3.3*   Recent Labs  Lab 08/18/20 0456  LIPASE 33   Urine analysis:    Component Value Date/Time   COLORURINE AMBER (A) 08/18/2020 0526   APPEARANCEUR HAZY (A) 08/18/2020 0526   LABSPEC 1.017 08/18/2020 0526   PHURINE 6.0 08/18/2020 0526   GLUCOSEU NEGATIVE 08/18/2020 0526   HGBUR NEGATIVE 08/18/2020 0526   BILIRUBINUR NEGATIVE 08/18/2020 0526   KETONESUR 5 (A) 08/18/2020 0526   PROTEINUR NEGATIVE 08/18/2020 0526   NITRITE NEGATIVE 08/18/2020 0526   LEUKOCYTESUR SMALL (A) 08/18/2020 0526   Wynona Duhamel N Stephanos Fan D.O. [facility]  If 12AM-7AM, please contact overnight-coverage provider If 7AM-7PM, please contact day coverage provider [email or web address]  08/18/2020, 3:31 PM

## 2020-08-18 NOTE — ED Notes (Signed)
Assisted pt commode. Pt O2 sats were 89%RA.  Assisted back to bed, placed on 2L [place], sats now 93%.

## 2020-08-18 NOTE — ED Triage Notes (Addendum)
EMS brought pt in from home for c/o N/V; +COVID on 11/25; pt to triage via w/c with no distress noted; seen recently in ED; pt reports N/V since last night; denies any accomp symptoms

## 2020-08-18 NOTE — ED Provider Notes (Addendum)
[facility] Emergency Department Provider Note   ____________________________________________    I have reviewed the triage vital signs and the nursing notes.   HISTORY  Chief Complaint Emesis     HPI Alexandria Mason is a 69 y.o. female who presents with complaints of nausea and vomiting. Patient has COVID-19 and reports that her breathing has been okay but she has had significant nausea and vomiting overnight. She called EMS when she was dry retching and because she was concerned that she was very dehydrated. Denies fevers. She did have monoclonal antibody infusion on 27 November. She reports feeling weak  History reviewed. No pertinent past medical history.  Patient Active Problem List   Diagnosis Date Noted  . Hyperlipidemia 08/08/2020  . Anxiety 10/20/2018    Past Surgical History:  Procedure Laterality Date  . BUNIONECTOMY     x 2-bilateral feet- different years for each one  . CATARACT EXTRACTION Left 2011  . oral surgeries    . WRIST FRACTURE SURGERY Right 08/2006   metal plate present    Prior to Admission medications   Medication Sig Start Date End Date Taking? Authorizing Provider  atorvastatin (LIPITOR) 10 MG tablet TAKE 1 TABLET BY MOUTH EVERY DAY 08/08/20   Lynnda Child, MD  azithromycin (ZITHROMAX) 250 MG tablet Take 2 tablets (500 mg) today. Then take 1 tablet daily for the next 4 days. 08/08/20   Lynnda Child, MD  benzonatate (TESSALON PERLES) 100 MG capsule Take 1 capsule (100 mg total) by mouth 3 (three) times daily as needed for cough. 08/08/20   Lynnda Child, MD  Naproxen Sodium (ALEVE) 220 MG CAPS Take by mouth daily as needed.    [provider]  ondansetron (ZOFRAN) 4 MG tablet Take 1 tablet (4 mg total) by mouth every 8 (eight) hours as needed for up to 10 doses for nausea or vomiting. 08/17/20   Gilles Chiquito, MD     Allergies Oxycodone-acetaminophen and Sulfa antibiotics  Family History    Problem Relation Age of Onset  . Congestive Heart Failure Mother   . Heart disease Father   . Heart attack Father 40  . Heart disease Brother   . Heart attack Paternal Grandfather     Social History Social History   Tobacco Use  . Smoking status: Never Smoker  . Smokeless tobacco: Never Used  Vaping Use  . Vaping Use: Never used  Substance Use Topics  . Alcohol use: Not Currently    Comment: rarely  . Drug use: Never    Review of Systems  Constitutional: Fatigue Eyes: No visual changes.  ENT: No sore throat. Cardiovascular: Denies chest pain. Respiratory: "Breathing is okay " Gastrointestinal: No abdominal pain. Nausea and vomiting Genitourinary: Negative for dysuria. Musculoskeletal: Negative for back pain. Skin: Negative for rash. Neurological: Negative for headaches   ____________________________________________   PHYSICAL EXAM:  VITAL SIGNS: ED Triage Vitals  Enc Vitals Group     BP 08/18/20 0450 (!) 111/57     Pulse Rate 08/18/20 0450 70     Resp 08/18/20 0450 20     Temp 08/18/20 0800 (!) 97.4 F (36.3 C)     Temp Source 08/18/20 0450 Oral     SpO2 08/18/20 0435 96 %     Weight 08/18/20 0451 72.6 kg (160 lb 0.9 oz)     Height 08/18/20 0451 1.575 m (5\' 2" )     Head Circumference --      Peak Flow --  Pain Score 08/18/20 0451 0     Pain Loc --      Pain Edu? --      Excl. in GC? --     Constitutional: Alert and oriented.  Nose: No congestion/rhinnorhea. Mouth/Throat: Mucous membranes are moist.   Neck:  Painless ROM Cardiovascular: Normal rate, regular rhythm. Grossly normal heart sounds.  Good peripheral circulation. Respiratory: Normal respiratory effort.  No retractions.  Gastrointestinal: Soft and nontender. No distention.  No CVA tenderness. Genitourinary: deferred Musculoskeletal: No lower extremity tenderness nor edema.  Warm and well perfused Neurologic:  Normal speech and language. No gross focal neurologic deficits are  appreciated.  Skin:  Skin is warm, dry and intact. No rash noted. Psychiatric: Mood and affect are normal. Speech and behavior are normal.  ____________________________________________   LABS (all labs ordered are listed, but only abnormal results are displayed)  Labs Reviewed  CBC WITH DIFFERENTIAL/PLATELET - Abnormal; Notable for the following components:      Result Value   Platelets 420 (*)    Neutro Abs 7.9 (*)    All other components within normal limits  COMPREHENSIVE METABOLIC PANEL - Abnormal; Notable for the following components:   Potassium 3.2 (*)    Glucose, Bld 128 (*)    Albumin 3.3 (*)    Alkaline Phosphatase 145 (*)    Total Bilirubin 1.6 (*)    All other components within normal limits  URINALYSIS, COMPLETE (UACMP) WITH MICROSCOPIC - Abnormal; Notable for the following components:   Color, Urine AMBER (*)    APPearance HAZY (*)    Ketones, ur 5 (*)    Leukocytes,Ua SMALL (*)    All other components within normal limits  LIPASE, BLOOD   ____________________________________________  EKG  ED ECG REPORT I, Jene Every, the attending physician, personally viewed and interpreted this ECG.  Date: 08/18/2020  Rhythm: normal sinus rhythm QRS Axis: normal Intervals: normal ST/T Wave abnormalities: normal Narrative Interpretation: no evidence of acute ischemia  ____________________________________________  RADIOLOGY  Chest x-ray reviewed by me, consistent with COVID-19 multifocal pneumonia ____________________________________________   PROCEDURES  Procedure(s) performed: No  Procedures   Critical Care performed: yes  CRITICAL CARE Performed by: Jene Every   Total critical care time: 30 minutes  Critical care time was exclusive of separately billable procedures and treating other patients.  Critical care was necessary to treat or prevent imminent or life-threatening deterioration.  Critical care was time spent personally by me on the  following activities: development of treatment plan with patient and/or surrogate as well as nursing, discussions with consultants, evaluation of patient's response to treatment, examination of patient, obtaining history from patient or surrogate, ordering and performing treatments and interventions, ordering and review of laboratory studies, ordering and review of radiographic studies, pulse oximetry and re-evaluation of patient's condition.  ____________________________________________   INITIAL IMPRESSION / ASSESSMENT AND PLAN / ED COURSE  Pertinent labs & imaging results that were available during my care of the patient were reviewed by me and considered in my medical decision making (see chart for details).  Patient with COVID-19 presents with nausea and vomiting and a sense of dehydration. Lab work is overall reassuring, BUN to creatinine ratio is reassuring.  She reports her breathing is okay and oxygen saturations are overall reassuring here in the emergency department, she is not requiring oxygen.  We will treat with IV fluids, IV Zofran and reevaluate  Patient desaturated to 88% with standing, we will start her on 2 L nasal cannula oxygen.  Given oxygen requirement, COVID-19 with likely pneumonia will admit to the hospitalist service    ____________________________________________   FINAL CLINICAL IMPRESSION(S) / ED DIAGNOSES  Final diagnoses:  Dehydration  COVID-19  Pneumonia due to COVID-19 virus  Acute hypoxemic respiratory failure due to COVID-19 [facility])        Note:  This document was prepared using Dragon voice recognition software and may include unintentional dictation errors.   Jene Every, MD 08/18/20 1205    Jene Every, MD 08/18/20 [phone]

## 2020-08-18 NOTE — ED Notes (Signed)
Patient placed on 2L [place] due to low O2 sat.

## 2020-08-19 LAB — CBC WITH DIFFERENTIAL/PLATELET
Abs Immature Granulocytes: 0.11 10*3/uL — ABNORMAL HIGH (ref 0.00–0.07)
Basophils Absolute: 0 10*3/uL (ref 0.0–0.1)
Basophils Relative: 0 %
Eosinophils Absolute: 0 10*3/uL (ref 0.0–0.5)
Eosinophils Relative: 0 %
HCT: 40 % (ref 36.0–46.0)
Hemoglobin: 13.2 g/dL (ref 12.0–15.0)
Immature Granulocytes: 1 %
Lymphocytes Relative: 18 %
Lymphs Abs: 1.5 10*3/uL (ref 0.7–4.0)
MCH: 30.1 pg (ref 26.0–34.0)
MCHC: 33 g/dL (ref 30.0–36.0)
MCV: 91.1 fL (ref 80.0–100.0)
Monocytes Absolute: 0.6 10*3/uL (ref 0.1–1.0)
Monocytes Relative: 7 %
Neutro Abs: 6.1 10*3/uL (ref 1.7–7.7)
Neutrophils Relative %: 74 %
Platelets: 434 10*3/uL — ABNORMAL HIGH (ref 150–400)
RBC: 4.39 MIL/uL (ref 3.87–5.11)
RDW: 12.1 % (ref 11.5–15.5)
WBC: 8.4 10*3/uL (ref 4.0–10.5)
nRBC: 0 % (ref 0.0–0.2)

## 2020-08-19 LAB — BASIC METABOLIC PANEL
Anion gap: 10 (ref 5–15)
BUN: 18 mg/dL (ref 8–23)
CO2: 25 mmol/L (ref 22–32)
Calcium: 8.5 mg/dL — ABNORMAL LOW (ref 8.9–10.3)
Chloride: 102 mmol/L (ref 98–111)
Creatinine, Ser: 0.89 mg/dL (ref 0.44–1.00)
GFR, Estimated: >60 mL/min (ref >60)
Glucose, Bld: 100 mg/dL — ABNORMAL HIGH (ref 70–99)
Potassium: 3.3 mmol/L — ABNORMAL LOW (ref 3.5–5.1)
Sodium: 137 mmol/L (ref 135–145)

## 2020-08-19 LAB — FIBRIN DERIVATIVES D-DIMER (ARMC ONLY): Fibrin derivatives D-dimer (ARMC): 1261.35 ng/mL (FEU) — ABNORMAL HIGH (ref 0.00–499.00)

## 2020-08-19 LAB — C-REACTIVE PROTEIN: CRP: 0.9 mg/dL (ref <1.0)

## 2020-08-19 LAB — HIV ANTIBODY (ROUTINE TESTING W REFLEX): HIV Screen 4th Generation wRfx: NONREACTIVE

## 2020-08-19 MED ORDER — SODIUM CHLORIDE 0.9 % IV SOLN
200.0000 mg | Freq: Once | INTRAVENOUS | Status: AC
  Administered 2020-08-19: 200 mg via INTRAVENOUS
  Filled 2020-08-19: qty 200

## 2020-08-19 MED ORDER — SODIUM CHLORIDE 0.9 % IV SOLN
100.0000 mg | Freq: Every day | INTRAVENOUS | Status: AC
  Administered 2020-08-20 – 2020-08-23 (×4): 100 mg via INTRAVENOUS
  Filled 2020-08-19: qty 100
  Filled 2020-08-19 (×3): qty 20

## 2020-08-19 NOTE — ED Notes (Signed)
This RN at bedside. Pt states that she has wet her brief. Brief and pad changed. Purewick repositioned at this time. Denies any needs at this time.

## 2020-08-19 NOTE — ED Notes (Signed)
Pt c/o nausea and vomiting at this time. See orders.

## 2020-08-19 NOTE — ED Notes (Addendum)
Spoke with pt's daughter Janann August, per pt and daughter, pt was allowed to come and visit the pt yesterday around 9:00pm. Spoke with Marylene Land, Consulting civil engineer and per [employer] we should not allow visitors for COVID pt and pt is not outside the 2 week quarantine window. Updated Janann August and told her she would be unable to come back and she should call for any updates. Daughter verbalized understanding at this time.

## 2020-08-19 NOTE — ED Notes (Signed)
Spoke to Therapist, sports that Bed is Ready.

## 2020-08-19 NOTE — ED Notes (Signed)
Patient had episode of incontinence of stool. Patient cleaned and brief changed. purwick replaced. Patient able to scoot herself up in bed. Patient given breakfast tray

## 2020-08-19 NOTE — Consult Note (Signed)
Remdesivir - Pharmacy Brief Note   O:  ALT: 15 CXR: Moderate patchy opacities throughout both lungs, stable to slightly worsened, compatible with COVID-19 pneumonia. SpO2: 95% on 3L   A/P:  Remdesivir 200 mg IVPB once followed by 100 mg IVPB daily x 4 days.   Albina Billet, PharmD, BCPS Clinical Pharmacist 08/19/2020 8:10 AM

## 2020-08-19 NOTE — Progress Notes (Signed)
PROGRESS NOTE    Alexandria Mason  TKW:[id number] DOB: 09/06/1951 DOA: 08/18/2020 PCP: Lynnda Child, MD   Brief Narrative:   69 y.o. female with medical history significant for hyperlipidemia presented to the ED for chief concern of nausea, vomiting, diarrhea that started about 10 days ago.   She was tested positive for covid on 08/08/20.  She is unvaccinated for COVID-19 infection.  Patient reports that she has tried to maintain hydration with p.o. intake of water however she is not able to keep anything down.  She endorses monoclonal antibody infusion on 08/12/2020 without improvement of symptoms.  12/4: Presentation patient was requiring 3 L supplemental oxygen. She has been weaned off however her symptoms of nausea vomiting and diarrhea persisted.    Assessment & Plan:   Principal Problem:   Acute hypoxemic respiratory failure due to COVID-19 [facility]) Active Problems:   Anxiety   HLD (hyperlipidemia)   Gastroenteritis due to COVID-19 virus   Hypokalemia  Multifocal pneumonia secondary to COVID-19 Acute hypoxic respiratory failure secondary to above Intractable nausea vomiting and diarrhea, presumably secondary to above Patient is unvaccinated She received intravenous steroids and antiemetics in ED Plan: Continue steroids, dose 2/10 Start remdesivir given the presence of pneumonia, dose 1/5 Supplemental oxygen, wean as tolerated Bronchodilator therapy As needed antitussives Stress incentive spirometry and flutter valve use Prone as tolerated As needed antiemetics No IV fluids  Hypokalemia Likely secondary to GI losses Check daily BMP and replace as needed  Hyperlipidemia Home atorvastatin continued   DVT prophylaxis: Lovenox Code Status: Full Family Communication: Daughter Janann August [phone] on 08/19/2020 Disposition Plan: Status is: Inpatient  Remains inpatient appropriate because:Inpatient level of care appropriate due to severity of  illness   Dispo: The patient is from: Home              Anticipated d/c is to: Home              Anticipated d/c date is: 1 day              Patient currently is not medically stable to d/c.  Has been weaned from supplemental oxygen at time of this note however remains symptomatic with intractable nausea and vomiting presumably secondary to COVID-19 related gastroenteritis. Anticipate 24 hours prior to disposition planning. Anticipate discharge home.   Consultants:   None  Procedures:   None  Antimicrobials:   Remdesivir   Subjective: Patient seen and examined this morning. Continues to endorse abdominal pain and nausea and vomiting. Some shortness of breath as well.  Objective: Vitals:   08/19/20 0900 08/19/20 0930 08/19/20 1100 08/19/20 1130  BP: 90/71 121/72 121/65   Pulse: (!) 54 (!) 56 (!) 59 62  Resp:      Temp:      TempSrc:      SpO2: 96% 92% 92% 93%  Weight:      Height:        Intake/Output Summary (Last 24 hours) at 08/19/2020 1324 Last data filed at 08/19/2020 0021 Gross per 24 hour  Intake 885.42 ml  Output --  Net 885.42 ml   Filed Weights   08/18/20 0451  Weight: 72.6 kg    Examination:  General exam: No acute distress. Appears fatigued Respiratory system: Poor respiratory effort. Normal work of breathing. Bibasilar crackles. 2 L Cardiovascular system: S1 & S2 heard, RRR. No JVD, murmurs, rubs, gallops or clicks. No pedal edema. Gastrointestinal system: Abdomen is nondistended, soft and nontender. No organomegaly or masses felt. Normal  bowel sounds heard. Central nervous system: Alert and oriented. No focal neurological deficits. Extremities: Symmetric 5 x 5 power. Skin: No rashes, lesions or ulcers Psychiatry: Judgement and insight appear normal. Mood & affect appropriate.     Data Reviewed: I have personally reviewed following labs and imaging studies  CBC: Recent Labs  Lab 08/16/20 2238 08/18/20 0456 08/19/20 0741  WBC 10.7*  10.2 8.4  NEUTROABS  --  7.9* 6.1  HGB 13.4 13.2 13.2  HCT 40.1 39.0 40.0  MCV 89.5 89.0 91.1  PLT 371 420* 434*   Basic Metabolic Panel: Recent Labs  Lab 08/16/20 2238 08/18/20 0456 08/19/20 0741  NA 138 139 137  K 3.1* 3.2* 3.3*  CL 100 102 102  CO2 25 26 25   GLUCOSE 181* 128* 100*  BUN 14 14 18   CREATININE 0.88 0.79 0.89  CALCIUM 8.5* 8.9 8.5*  MG 2.2  --   --    GFR: Estimated Creatinine Clearance: 54.9 mL/min (by C-G formula based on SCr of 0.89 mg/dL). Liver Function Tests: Recent Labs  Lab 08/18/20 0456  AST 23  ALT 15  ALKPHOS 145*  BILITOT 1.6*  PROT 6.6  ALBUMIN 3.3*   Recent Labs  Lab 08/18/20 0456  LIPASE 33   No results for input(s): AMMONIA in the last 168 hours. Coagulation Profile: No results for input(s): INR, PROTIME in the last 168 hours. Cardiac Enzymes: No results for input(s): CKTOTAL, CKMB, CKMBINDEX, TROPONINI in the last 168 hours. BNP (last 3 results) No results for input(s): PROBNP in the last 8760 hours. HbA1C: No results for input(s): HGBA1C in the last 72 hours. CBG: No results for input(s): GLUCAP in the last 168 hours. Lipid Profile: No results for input(s): CHOL, HDL, LDLCALC, TRIG, CHOLHDL, LDLDIRECT in the last 72 hours. Thyroid Function Tests: No results for input(s): TSH, T4TOTAL, FREET4, T3FREE, THYROIDAB in the last 72 hours. Anemia Panel: No results for input(s): VITAMINB12, FOLATE, FERRITIN, TIBC, IRON, RETICCTPCT in the last 72 hours. Sepsis Labs: Recent Labs  Lab 08/16/20 2238 08/16/20 2358  LATICACIDVEN 2.3* 1.3    No results found for this or any previous visit (from the past 240 hour(s)).       Radiology Studies: DG Chest Port 1 View  Result Date: 08/18/2020 CLINICAL DATA:  COVID-19 positive, nausea and vomiting EXAM: PORTABLE CHEST 1 VIEW COMPARISON:  08/16/2020 chest radiograph. FINDINGS: Stable cardiomediastinal silhouette with top-normal heart size. No pneumothorax. No pleural effusion.  Moderate patchy opacities throughout both lungs, predominantly peripheral, stable to slightly worsened. IMPRESSION: Moderate patchy opacities throughout both lungs, stable to slightly worsened, compatible with COVID-19 pneumonia. Electronically Signed   By: 14/11/2019 M.D.   On: 08/18/2020 12:49        Scheduled Meds: . atorvastatin  10 mg Oral QHS  . citric acid-potassium citrate  40 mEq Oral Once  . enoxaparin (LOVENOX) injection  40 mg Subcutaneous Q24H  . methylPREDNISolone (SOLU-MEDROL) injection  1 mg/kg Intravenous Q12H   Followed by  . [START ON 08/22/2020] predniSONE  50 mg Oral Daily   Continuous Infusions: . [START ON 08/20/2020] remdesivir 100 mg in NS 100 mL       LOS: 1 day    Time spent: 25 minutes    14/03/2020, MD [facility] Pager [phone]  If 7PM-7AM, please contact night-coverage 08/19/2020, 1:24 PM

## 2020-08-19 NOTE — ED Notes (Signed)
Malka RN reported no further questions on patient. Report complete

## 2020-08-19 NOTE — ED Notes (Signed)
Patient brief changed. Peri care performed. Positioned pt in bed for comfort. Denies further needs at this time.

## 2020-08-19 NOTE — ED Notes (Signed)
Patients brief cleaned and pads changed from incontinence of stool

## 2020-08-20 LAB — BASIC METABOLIC PANEL
Anion gap: 8 (ref 5–15)
BUN: 22 mg/dL (ref 8–23)
CO2: 26 mmol/L (ref 22–32)
Calcium: 8.6 mg/dL — ABNORMAL LOW (ref 8.9–10.3)
Chloride: 104 mmol/L (ref 98–111)
Creatinine, Ser: 0.78 mg/dL (ref 0.44–1.00)
GFR, Estimated: >60 mL/min (ref >60)
Glucose, Bld: 129 mg/dL — ABNORMAL HIGH (ref 70–99)
Potassium: 3.7 mmol/L (ref 3.5–5.1)
Sodium: 138 mmol/L (ref 135–145)

## 2020-08-20 LAB — CBC WITH DIFFERENTIAL/PLATELET
Abs Immature Granulocytes: 0.08 10*3/uL — ABNORMAL HIGH (ref 0.00–0.07)
Basophils Absolute: 0 10*3/uL (ref 0.0–0.1)
Basophils Relative: 0 %
Eosinophils Absolute: 0 10*3/uL (ref 0.0–0.5)
Eosinophils Relative: 0 %
HCT: 38.7 % (ref 36.0–46.0)
Hemoglobin: 13.1 g/dL (ref 12.0–15.0)
Immature Granulocytes: 1 %
Lymphocytes Relative: 15 %
Lymphs Abs: 1.1 10*3/uL (ref 0.7–4.0)
MCH: 30.4 pg (ref 26.0–34.0)
MCHC: 33.9 g/dL (ref 30.0–36.0)
MCV: 89.8 fL (ref 80.0–100.0)
Monocytes Absolute: 0.2 10*3/uL (ref 0.1–1.0)
Monocytes Relative: 2 %
Neutro Abs: 5.7 10*3/uL (ref 1.7–7.7)
Neutrophils Relative %: 82 %
Platelets: 448 10*3/uL — ABNORMAL HIGH (ref 150–400)
RBC: 4.31 MIL/uL (ref 3.87–5.11)
RDW: 11.9 % (ref 11.5–15.5)
WBC: 7 10*3/uL (ref 4.0–10.5)
nRBC: 0 % (ref 0.0–0.2)

## 2020-08-20 LAB — FIBRIN DERIVATIVES D-DIMER (ARMC ONLY): Fibrin derivatives D-dimer (ARMC): 988.71 ng/mL (FEU) — ABNORMAL HIGH (ref 0.00–499.00)

## 2020-08-20 LAB — C-REACTIVE PROTEIN: CRP: 0.6 mg/dL (ref <1.0)

## 2020-08-20 MED ORDER — ORAL CARE MOUTH RINSE
15.0000 mL | Freq: Two times a day (BID) | OROMUCOSAL | Status: DC
  Administered 2020-08-20 – 2020-08-24 (×9): 15 mL via OROMUCOSAL

## 2020-08-20 MED ORDER — FUROSEMIDE 10 MG/ML IJ SOLN
40.0000 mg | Freq: Once | INTRAMUSCULAR | Status: AC
  Administered 2020-08-20: 10:00:00 40 mg via INTRAVENOUS
  Filled 2020-08-20: qty 4

## 2020-08-20 MED ORDER — SODIUM CHLORIDE 0.9 % IV SOLN
INTRAVENOUS | Status: DC | PRN
  Administered 2020-08-20 – 2020-08-23 (×2): 250 mL via INTRAVENOUS

## 2020-08-20 NOTE — Progress Notes (Signed)
PROGRESS NOTE    Alexandria Mason  GLO:[id number] DOB: 25-Feb-1951 DOA: 08/18/2020 PCP: Lynnda Child, MD   Brief Narrative:   69 y.o. female with medical history significant for hyperlipidemia presented to the ED for chief concern of nausea, vomiting, diarrhea that started about 10 days ago.   She was tested positive for covid on 08/08/20.  She is unvaccinated for COVID-19 infection.  Patient reports that she has tried to maintain hydration with p.o. intake of water however she is not able to keep anything down.  She endorses monoclonal antibody infusion on 08/12/2020 without improvement of symptoms.  12/4: Presentation patient was requiring 3 L supplemental oxygen. She has been weaned off however her symptoms of nausea vomiting and diarrhea persisted.  12/5: Back on oxygen, 3 L to maintain saturations greater than 88%.  Still feels weak but improving over interval.    Assessment & Plan:   Principal Problem:   Acute hypoxemic respiratory failure due to COVID-19 [facility]) Active Problems:   Anxiety   HLD (hyperlipidemia)   Gastroenteritis due to COVID-19 virus   Hypokalemia  Multifocal pneumonia secondary to COVID-19 Acute hypoxic respiratory failure secondary to above Intractable nausea vomiting and diarrhea, presumably secondary to above Patient is unvaccinated She received intravenous steroids and antiemetics in ED Respiratory status and GI symptoms improving over interval Plan: Continue remdesivir, dose 2/5 Continue intravenous steroids, dose 3/10.  Can likely transition to p.o. prednisone tomorrow Wean oxygen as tolerated Bronchodilator therapy As needed antitussives Stress incentive spirometry and flutter valve use Prone as tolerated As needed antiemetics No IV fluids  Hypokalemia Likely secondary to GI losses Check daily BMP and replace as needed  Hyperlipidemia Home atorvastatin continued   DVT prophylaxis: Lovenox Code Status: Full Family Communication:  Daughter Janann August [phone] on 08/19/2020 Disposition Plan: Status is: Inpatient  Remains inpatient appropriate because:Inpatient level of care appropriate due to severity of illness   Dispo: The patient is from: Home              Anticipated d/c is to: Home              Anticipated d/c date is: 1 day              Patient currently is not medically stable to d/c.   Was previously weaned from submental oxygen however was placed back on due to inability to maintain saturation greater than 88%.  Symptoms of gastroenteritis slowly improving.  P.o. intake slowly picking up and energy level improving.  Anticipate 24hours more in the hospital.  Anticipate discharge home 08/21/2020   Consultants:   None  Procedures:   None  Antimicrobials:   Remdesivir   Subjective: Patient seen and examined.  Abdominal pain and nausea improving.  Shortness of breath also improving.  Remains on 3 L nasal cannula.    Objective: Vitals:   08/19/20 1800 08/19/20 1843 08/20/20 0005 08/20/20 0831  BP: 114/61 135/64 130/65 (!) 106/54  Pulse: (!) 57 (!) 55 63 65  Resp:  18 16 20   Temp:  98.7 F (37.1 C) 98.1 F (36.7 C) 98 F (36.7 C)  TempSrc:  Oral Oral Oral  SpO2: 95% 100% 96% 94%  Weight:  74.6 kg    Height:  5\' 2"  (1.575 m)     No intake or output data in the 24 hours ending 08/20/20 1039 Filed Weights   08/18/20 0451 08/19/20 1843  Weight: 72.6 kg 74.6 kg    Examination:  General exam: No acute  distress.  Appears frail Respiratory system: Normal work of breathing.  Bilateral crackles.  3 L  cardiovascular system: S1 & S2 heard, RRR. No JVD, murmurs, rubs, gallops or clicks. No pedal edema. Gastrointestinal system: Abdomen is nondistended, soft and nontender. No organomegaly or masses felt. Normal bowel sounds heard. Central nervous system: Alert and oriented. No focal neurological deficits. Extremities: Symmetric 5 x 5 power. Skin: No rashes, lesions or ulcers Psychiatry:  Judgement and insight appear normal. Mood & affect appropriate.     Data Reviewed: I have personally reviewed following labs and imaging studies  CBC: Recent Labs  Lab 08/16/20 2238 08/18/20 0456 08/19/20 0741 08/20/20 0505  WBC 10.7* 10.2 8.4 7.0  NEUTROABS  --  7.9* 6.1 5.7  HGB 13.4 13.2 13.2 13.1  HCT 40.1 39.0 40.0 38.7  MCV 89.5 89.0 91.1 89.8  PLT 371 420* 434* 448*   Basic Metabolic Panel: Recent Labs  Lab 08/16/20 2238 08/18/20 0456 08/19/20 0741 08/20/20 0505  NA 138 139 137 138  K 3.1* 3.2* 3.3* 3.7  CL 100 102 102 104  CO2 25 26 25 26   GLUCOSE 181* 128* 100* 129*  BUN 14 14 18 22   CREATININE 0.88 0.79 0.89 0.78  CALCIUM 8.5* 8.9 8.5* 8.6*  MG 2.2  --   --   --    GFR: Estimated Creatinine Clearance: 61.9 mL/min (by C-G formula based on SCr of 0.78 mg/dL). Liver Function Tests: Recent Labs  Lab 08/18/20 0456  AST 23  ALT 15  ALKPHOS 145*  BILITOT 1.6*  PROT 6.6  ALBUMIN 3.3*   Recent Labs  Lab 08/18/20 0456  LIPASE 33   No results for input(s): AMMONIA in the last 168 hours. Coagulation Profile: No results for input(s): INR, PROTIME in the last 168 hours. Cardiac Enzymes: No results for input(s): CKTOTAL, CKMB, CKMBINDEX, TROPONINI in the last 168 hours. BNP (last 3 results) No results for input(s): PROBNP in the last 8760 hours. HbA1C: No results for input(s): HGBA1C in the last 72 hours. CBG: No results for input(s): GLUCAP in the last 168 hours. Lipid Profile: No results for input(s): CHOL, HDL, LDLCALC, TRIG, CHOLHDL, LDLDIRECT in the last 72 hours. Thyroid Function Tests: No results for input(s): TSH, T4TOTAL, FREET4, T3FREE, THYROIDAB in the last 72 hours. Anemia Panel: No results for input(s): VITAMINB12, FOLATE, FERRITIN, TIBC, IRON, RETICCTPCT in the last 72 hours. Sepsis Labs: Recent Labs  Lab 08/16/20 2238 08/16/20 2358  LATICACIDVEN 2.3* 1.3    No results found for this or any previous visit (from the past 240  hour(s)).       Radiology Studies: DG Chest Port 1 View  Result Date: 08/18/2020 CLINICAL DATA:  COVID-19 positive, nausea and vomiting EXAM: PORTABLE CHEST 1 VIEW COMPARISON:  08/16/2020 chest radiograph. FINDINGS: Stable cardiomediastinal silhouette with top-normal heart size. No pneumothorax. No pleural effusion. Moderate patchy opacities throughout both lungs, predominantly peripheral, stable to slightly worsened. IMPRESSION: Moderate patchy opacities throughout both lungs, stable to slightly worsened, compatible with COVID-19 pneumonia. Electronically Signed   By: 14/11/2019 M.D.   On: 08/18/2020 12:49        Scheduled Meds: . atorvastatin  10 mg Oral QHS  . citric acid-potassium citrate  40 mEq Oral Once  . enoxaparin (LOVENOX) injection  40 mg Subcutaneous Q24H  . mouth rinse  15 mL Mouth Rinse BID  . methylPREDNISolone (SOLU-MEDROL) injection  1 mg/kg Intravenous Q12H   Followed by  . [START ON 08/22/2020] predniSONE  50 mg  Oral Daily   Continuous Infusions: . sodium chloride 250 mL (08/20/20 1011)  . remdesivir 100 mg in NS 100 mL 100 mg (08/20/20 1013)     LOS: 2 days    Time spent: 25 minutes    Tresa Moore, MD [facility] Pager [phone]  If 7PM-7AM, please contact night-coverage 08/20/2020, 10:39 AM

## 2020-08-21 LAB — CBC WITH DIFFERENTIAL/PLATELET
Abs Immature Granulocytes: 0.1 10*3/uL — ABNORMAL HIGH (ref 0.00–0.07)
Basophils Absolute: 0 10*3/uL (ref 0.0–0.1)
Basophils Relative: 0 %
Eosinophils Absolute: 0 10*3/uL (ref 0.0–0.5)
Eosinophils Relative: 0 %
HCT: 39.6 % (ref 36.0–46.0)
Hemoglobin: 13.6 g/dL (ref 12.0–15.0)
Immature Granulocytes: 1 %
Lymphocytes Relative: 13 %
Lymphs Abs: 1.1 10*3/uL (ref 0.7–4.0)
MCH: 30.2 pg (ref 26.0–34.0)
MCHC: 34.3 g/dL (ref 30.0–36.0)
MCV: 88 fL (ref 80.0–100.0)
Monocytes Absolute: 0.3 10*3/uL (ref 0.1–1.0)
Monocytes Relative: 4 %
Neutro Abs: 7.1 10*3/uL (ref 1.7–7.7)
Neutrophils Relative %: 82 %
Platelets: 482 10*3/uL — ABNORMAL HIGH (ref 150–400)
RBC: 4.5 MIL/uL (ref 3.87–5.11)
RDW: 11.9 % (ref 11.5–15.5)
WBC: 8.7 10*3/uL (ref 4.0–10.5)
nRBC: 0 % (ref 0.0–0.2)

## 2020-08-21 LAB — C-REACTIVE PROTEIN: CRP: 0.6 mg/dL (ref <1.0)

## 2020-08-21 LAB — BASIC METABOLIC PANEL
Anion gap: 8 (ref 5–15)
BUN: 28 mg/dL — ABNORMAL HIGH (ref 8–23)
CO2: 29 mmol/L (ref 22–32)
Calcium: 8.7 mg/dL — ABNORMAL LOW (ref 8.9–10.3)
Chloride: 101 mmol/L (ref 98–111)
Creatinine, Ser: 0.99 mg/dL (ref 0.44–1.00)
GFR, Estimated: >60 mL/min (ref >60)
Glucose, Bld: 149 mg/dL — ABNORMAL HIGH (ref 70–99)
Potassium: 3.8 mmol/L (ref 3.5–5.1)
Sodium: 138 mmol/L (ref 135–145)

## 2020-08-21 LAB — FIBRIN DERIVATIVES D-DIMER (ARMC ONLY): Fibrin derivatives D-dimer (ARMC): 741.37 ng/mL (FEU) — ABNORMAL HIGH (ref 0.00–499.00)

## 2020-08-21 MED ORDER — POLYETHYLENE GLYCOL 3350 17 G PO PACK
17.0000 g | PACK | Freq: Every day | ORAL | Status: DC | PRN
  Administered 2020-08-22: 17 g via ORAL
  Filled 2020-08-21: qty 1

## 2020-08-21 MED ORDER — SENNOSIDES-DOCUSATE SODIUM 8.6-50 MG PO TABS
1.0000 | ORAL_TABLET | Freq: Two times a day (BID) | ORAL | Status: DC
  Administered 2020-08-21 – 2020-08-22 (×3): 1 via ORAL
  Filled 2020-08-21 (×4): qty 1

## 2020-08-21 NOTE — TOC Initial Note (Signed)
Transition of Care [facility]) - Initial/Assessment Note    Patient Details  Name: Alexandria Mason MRN: [id number] Date of Birth: August 06, 1951  Transition of Care [facility]) CM/SW Contact:    Allayne Butcher, RN Phone Number: 08/21/2020, 3:09 PM  Clinical Narrative:                 Patient admitted to the hospital with COVID requiring acute supplemental oxygen.  Patient is currently on 2L [place].  RNCM spoke with patient via phone.  Patient reports that she is from home where she lives alone and is independent and drives.  Pt would like to have home health services at discharge as she is very weak.  Patient does not have a preference in agency and is okay with the referral going to [facility].  Barbara Cower accepted referral for RN, PT, and OT.  Potential for discharge tomorrow.  Patient would also like a walker at home.  Patient will likely need home oxygen.  Walker and oxygen will be ordered from [facility] and delivered to the patient's room before discharge home.  Patient's daughter, Efraim Kaufmann will be picking her up when ready for DC.    Expected Discharge Plan: Home w Home Health Services Barriers to Discharge: Continued Medical Work up   Patient Goals and CMS Choice Patient states their goals for this hospitalization and ongoing recovery are:: Patient reports she will do what she needs to to get home CMS Medicare.gov Compare Post Acute Care list provided to:: Patient Choice offered to / list presented to : Patient  Expected Discharge Plan and Services Expected Discharge Plan: Home w Home Health Services   Discharge Planning Services: CM Consult Post Acute Care Choice: Home Health Living arrangements for the past 2 months: Single Family Home                 DME Arranged: Walker rolling DME Agency: [facility]       HH Arranged: RN, PT, OT HH Agency: [facility] Date HH Agency Contacted: 08/21/20 Time HH Agency Contacted: 1508 Representative spoke with at [facility] Agency: Feliberto Gottron  Prior Living Arrangements/Services Living arrangements for the past 2 months: Single Family Home Lives with:: Self Patient language and need for interpreter reviewed:: Yes Do you feel safe going back to the place where you live?: Yes      Need for Family Participation in Patient Care: Yes (Comment) (COVID) Care giver support system in place?: Yes (comment) (daughter and neighbors)   Criminal Activity/Legal Involvement Pertinent to Current Situation/Hospitalization: No - Comment as needed  Activities of Daily Living Home Assistive Devices/Equipment: Dentures (specify type) ADL Screening (condition at time of admission) Patient's cognitive ability adequate to safely complete daily activities?: Yes Is the patient deaf or have difficulty hearing?: No Does the patient have difficulty seeing, even when wearing glasses/contacts?: No Does the patient have difficulty concentrating, remembering, or making decisions?: No Patient able to express need for assistance with ADLs?: Yes Does the patient have difficulty dressing or bathing?: No Independently performs ADLs?: Yes (appropriate for developmental age) Communication: Independent Dressing (OT): Independent Grooming: Independent Feeding: Independent Bathing: Independent Toileting: Independent In/Out Bed: Needs assistance Is this a change from baseline?: Pre-admission baseline Walks in Home: Independent Does the patient have difficulty walking or climbing stairs?: No Weakness of Legs: None Weakness of Arms/Hands: None  Permission Sought/Granted Permission sought to share information with : Case Manager, Family Supports, Other (comment) Permission granted to share information with : Yes, Verbal Permission Granted  Share Information with NAME: Melissa  Permission granted to share info w AGENCY: [facility]  Permission granted to share info w Relationship: daughter     Emotional Assessment   Attitude/Demeanor/Rapport:  Engaged Affect (typically observed): Accepting Orientation: : Oriented to Self, Oriented to Place, Oriented to  Time, Oriented to Situation Alcohol / Substance Use: Not Applicable Psych Involvement: No (comment)  Admission diagnosis:  Dehydration [E86.0] Acute hypoxemic respiratory failure due to COVID-19 (HCC) [U07.1, J96.01] Pneumonia due to COVID-19 virus [U07.1, J12.82] COVID-19 [U07.1] Patient Active Problem List   Diagnosis Date Noted  . Acute hypoxemic respiratory failure due to COVID-19 (HCC) 08/18/2020  . Gastroenteritis due to COVID-19 virus 08/18/2020  . Hypokalemia 08/18/2020  . HLD (hyperlipidemia) 08/08/2020  . Anxiety 10/20/2018   PCP:  Lynnda Child, MD Pharmacy:   [facility] [address] [place] - [address] [place] [address] Phone: [phone] Fax: [phone]     Social Determinants of Health (SDOH) Interventions    Readmission Risk Interventions No flowsheet data found.

## 2020-08-21 NOTE — Care Management Important Message (Signed)
Important Message  Patient Details  Name: Alexandria Mason MRN: [id number] Date of Birth: 1951/08/17   Medicare Important Message Given:  N/A - LOS <3 / Initial given by admissions  Initial Medicare IM reviewed with Janann August, daughter by Bascom Levels, Patient Access Associate on 08/20/2020 at 9:00am.     Johnell Comings 08/21/2020, 2:02 PM

## 2020-08-21 NOTE — Progress Notes (Signed)
PROGRESS NOTE    Alexandria Mason  [id number]:[id number] DOB: 12-Jun-1951 DOA: 08/18/2020 PCP: Lynnda Child, MD   Brief Narrative:   69 y.o. female with medical history significant for hyperlipidemia presented to the ED for chief concern of nausea, vomiting, diarrhea that started about 10 days ago.   She was tested positive for covid on 08/08/20.  She is unvaccinated for COVID-19 infection.  Patient reports that she has tried to maintain hydration with p.o. intake of water however she is not able to keep anything down.  She endorses monoclonal antibody infusion on 08/12/2020 without improvement of symptoms.  12/4: Presentation patient was requiring 3 L supplemental oxygen. She has been weaned off however her symptoms of nausea vomiting and diarrhea persisted.  12/5: Back on oxygen, 3 L to maintain saturations greater than 88%.  Still feels weak but improving over interval.  12/6: On 2 L this morning.  Respiratory status improving however patient significantly weak and debilitated.  Gets winded with minimal exertion    Assessment & Plan:   Principal Problem:   Acute hypoxemic respiratory failure due to COVID-19 [facility]) Active Problems:   Anxiety   HLD (hyperlipidemia)   Gastroenteritis due to COVID-19 virus   Hypokalemia  Multifocal pneumonia secondary to COVID-19 Acute hypoxic respiratory failure secondary to above Intractable nausea vomiting and diarrhea, presumably secondary to above Patient is unvaccinated She received intravenous steroids and antiemetics in ED Respiratory status and GI symptoms improving over interval Plan: Continue remdesivir, dose 4/5 Continue intravenous steroids, dose 4/10.  Can likely transition to p.o. prednisone tomorrow Wean oxygen as tolerated Bronchodilator therapy As needed antitussives Stress incentive spirometry and flutter valve use Prone as tolerated As needed antiemetics No IV fluids  Hypokalemia Likely secondary to GI losses Check  daily BMP and replace as needed  Hyperlipidemia Home atorvastatin continued   DVT prophylaxis: Lovenox Code Status: Full Family Communication: Daughter Janann August [phone] on 08/21/2020 Disposition Plan: Status is: Inpatient  Remains inpatient appropriate because:Inpatient level of care appropriate due to severity of illness   Dispo: The patient is from: Home              Anticipated d/c is to: Home              Anticipated d/c date is: 1 day              Patient currently is not medically stable to d/c.   Weaned to 2 L.  Still very weak and debilitated.  Anticipate discharge home with home health 08/22/2020.         Consultants:   None  Procedures:   None  Antimicrobials:   Remdesivir   Subjective: Patient seen and examined.  Abdominal pain and nausea improved.  Tolerating p.o.  Shortness of breath improving.  Remains on 2 L  Objective: Vitals:   08/20/20 1620 08/20/20 1941 08/21/20 0037 08/21/20 0504  BP: 107/68 (!) 107/55 (!) 114/59 (!) 101/58  Pulse: (!) 54 (!) 58 (!) 57 (!) 51  Resp: 18 16 16 16   Temp: 97.7 F (36.5 C) 98 F (36.7 C) 97.9 F (36.6 C) 97.9 F (36.6 C)  TempSrc: Oral Oral    SpO2: 98% 93% 94% 93%  Weight:      Height:       No intake or output data in the 24 hours ending 08/21/20 1328 Filed Weights   08/18/20 0451 08/19/20 1843  Weight: 72.6 kg 74.6 kg    Examination:  General exam: No acute  distress.  Appears frail Respiratory system: Normal work of breathing.  Bilateral crackles.  3 L  cardiovascular system: S1 & S2 heard, RRR. No JVD, murmurs, rubs, gallops or clicks. No pedal edema. Gastrointestinal system: Abdomen is nondistended, soft and nontender. No organomegaly or masses felt. Normal bowel sounds heard. Central nervous system: Alert and oriented. No focal neurological deficits. Extremities: Symmetric 5 x 5 power. Skin: No rashes, lesions or ulcers Psychiatry: Judgement and insight appear normal. Mood &  affect appropriate.     Data Reviewed: I have personally reviewed following labs and imaging studies  CBC: Recent Labs  Lab 08/16/20 2238 08/18/20 0456 08/19/20 0741 08/20/20 0505 08/21/20 0436  WBC 10.7* 10.2 8.4 7.0 8.7  NEUTROABS  --  7.9* 6.1 5.7 7.1  HGB 13.4 13.2 13.2 13.1 13.6  HCT 40.1 39.0 40.0 38.7 39.6  MCV 89.5 89.0 91.1 89.8 88.0  PLT 371 420* 434* 448* 482*   Basic Metabolic Panel: Recent Labs  Lab 08/16/20 2238 08/18/20 0456 08/19/20 0741 08/20/20 0505 08/21/20 0436  NA 138 139 137 138 138  K 3.1* 3.2* 3.3* 3.7 3.8  CL 100 102 102 104 101  CO2 25 26 25 26 29   GLUCOSE 181* 128* 100* 129* 149*  BUN 14 14 18 22  28*  CREATININE 0.88 0.79 0.89 0.78 0.99  CALCIUM 8.5* 8.9 8.5* 8.6* 8.7*  MG 2.2  --   --   --   --    GFR: Estimated Creatinine Clearance: 50 mL/min (by C-G formula based on SCr of 0.99 mg/dL). Liver Function Tests: Recent Labs  Lab 08/18/20 0456  AST 23  ALT 15  ALKPHOS 145*  BILITOT 1.6*  PROT 6.6  ALBUMIN 3.3*   Recent Labs  Lab 08/18/20 0456  LIPASE 33   No results for input(s): AMMONIA in the last 168 hours. Coagulation Profile: No results for input(s): INR, PROTIME in the last 168 hours. Cardiac Enzymes: No results for input(s): CKTOTAL, CKMB, CKMBINDEX, TROPONINI in the last 168 hours. BNP (last 3 results) No results for input(s): PROBNP in the last 8760 hours. HbA1C: No results for input(s): HGBA1C in the last 72 hours. CBG: No results for input(s): GLUCAP in the last 168 hours. Lipid Profile: No results for input(s): CHOL, HDL, LDLCALC, TRIG, CHOLHDL, LDLDIRECT in the last 72 hours. Thyroid Function Tests: No results for input(s): TSH, T4TOTAL, FREET4, T3FREE, THYROIDAB in the last 72 hours. Anemia Panel: No results for input(s): VITAMINB12, FOLATE, FERRITIN, TIBC, IRON, RETICCTPCT in the last 72 hours. Sepsis Labs: Recent Labs  Lab 08/16/20 2238 08/16/20 2358  LATICACIDVEN 2.3* 1.3    No results found for  this or any previous visit (from the past 240 hour(s)).       Radiology Studies: No results found.      Scheduled Meds: . atorvastatin  10 mg Oral QHS  . citric acid-potassium citrate  40 mEq Oral Once  . enoxaparin (LOVENOX) injection  40 mg Subcutaneous Q24H  . mouth rinse  15 mL Mouth Rinse BID  . methylPREDNISolone (SOLU-MEDROL) injection  1 mg/kg Intravenous Q12H   Followed by  . [START ON 08/22/2020] predniSONE  50 mg Oral Daily   Continuous Infusions: . sodium chloride Stopped (08/20/20 1300)  . remdesivir 100 mg in NS 100 mL 100 mg (08/21/20 1034)     LOS: 3 days    Time spent: 15 minutes    14/05/21, MD [facility] Pager [phone]  If 7PM-7AM, please contact night-coverage 08/21/2020, 1:28  PM  

## 2020-08-21 NOTE — Plan of Care (Signed)
  Problem: Education: Goal: Knowledge of General Education information will improve Description: Including pain rating scale, medication(s)/side effects and non-pharmacologic comfort measures Outcome: Progressing   Problem: Clinical Measurements: Goal: Ability to maintain clinical measurements within normal limits will improve Outcome: Progressing Goal: Will remain free from infection Outcome: Progressing Goal: Diagnostic test results will improve Outcome: Progressing Goal: Cardiovascular complication will be avoided Outcome: Progressing   Problem: Nutrition: Goal: Adequate nutrition will be maintained Outcome: Progressing   Problem: Coping: Goal: Level of anxiety will decrease Outcome: Progressing   Problem: Elimination: Goal: Will not experience complications related to bowel motility Outcome: Progressing Goal: Will not experience complications related to urinary retention Outcome: Progressing   Problem: Pain Managment: Goal: General experience of comfort will improve Outcome: Progressing   Problem: Safety: Goal: Ability to remain free from injury will improve Outcome: Progressing   Problem: Skin Integrity: Goal: Risk for impaired skin integrity will decrease Outcome: Progressing   

## 2020-08-22 LAB — BASIC METABOLIC PANEL
Anion gap: 8 (ref 5–15)
BUN: 29 mg/dL — ABNORMAL HIGH (ref 8–23)
CO2: 27 mmol/L (ref 22–32)
Calcium: 8.4 mg/dL — ABNORMAL LOW (ref 8.9–10.3)
Chloride: 101 mmol/L (ref 98–111)
Creatinine, Ser: 0.82 mg/dL (ref 0.44–1.00)
GFR, Estimated: >60 mL/min (ref >60)
Glucose, Bld: 149 mg/dL — ABNORMAL HIGH (ref 70–99)
Potassium: 3.9 mmol/L (ref 3.5–5.1)
Sodium: 136 mmol/L (ref 135–145)

## 2020-08-22 LAB — C-REACTIVE PROTEIN: CRP: 0.6 mg/dL (ref <1.0)

## 2020-08-22 LAB — FIBRIN DERIVATIVES D-DIMER (ARMC ONLY): Fibrin derivatives D-dimer (ARMC): 576.54 ng/mL (FEU) — ABNORMAL HIGH (ref 0.00–499.00)

## 2020-08-22 NOTE — Progress Notes (Signed)
PROGRESS NOTE    Alexandria Mason  PFX:[id number] DOB: 24-May-1951 DOA: 08/18/2020 PCP: Lynnda Child, MD   Brief Narrative:   69 y.o. female with medical history significant for hyperlipidemia presented to the ED for chief concern of nausea, vomiting, diarrhea that started about 10 days ago.   She was tested positive for covid on 08/08/20.  She is unvaccinated for COVID-19 infection.  Patient reports that she has tried to maintain hydration with p.o. intake of water however she is not able to keep anything down.  She endorses monoclonal antibody infusion on 08/12/2020 without improvement of symptoms. 12/4: Presentation patient was requiring 3 L supplemental oxygen. She has been weaned off however her symptoms of nausea vomiting and diarrhea persisted. 12/5: Back on oxygen, 3 L to maintain saturations greater than 88%.  Still feels weak but improving over interval. 12/6: On 2 L this morning.  Respiratory status improving however patient significantly weak and debilitated.  Gets winded with minimal exertion 12/7: On 3 to 4 L as of this morning.  Clinically appears improved however patient significantly weak and debilitated.    Assessment & Plan:   Principal Problem:   Acute hypoxemic respiratory failure due to COVID-19 [facility]) Active Problems:   Anxiety   HLD (hyperlipidemia)   Gastroenteritis due to COVID-19 virus   Hypokalemia  Multifocal pneumonia secondary to COVID-19 Acute hypoxic respiratory failure secondary to above Intractable nausea vomiting and diarrhea, presumably secondary to above Patient is unvaccinated She received intravenous steroids and antiemetics in ED Respiratory status and GI symptoms improving over interval Plan: Continue remdesivir last dose today Continue intravenous steroids, dose 5/10.  Can likely transition to p.o. prednisone as respiratory status improves Wean oxygen as tolerated Bronchodilator therapy As needed antitussives Stress incentive  spirometry and flutter valve use Prone as tolerated As needed antiemetics No IV fluids Anticipate medical readiness for discharge home 08/23/2020.  Home health ordered  Hypokalemia Likely secondary to GI losses Check daily BMP and replace as needed  Hyperlipidemia Home atorvastatin continued   DVT prophylaxis: Lovenox Code Status: Full Family Communication: Daughter Janann August [phone] on 08/21/2020 Disposition Plan: Status is: Inpatient  Remains inpatient appropriate because:Inpatient level of care appropriate due to severity of illness   Dispo: The patient is from: Home              Anticipated d/c is to: Home              Anticipated d/c date is: 1 day              Patient currently is not medically stable to d/c.   Oxygen demand seems to wax and wane.  Patient was previously on 2 L however now requires 3 to 4 L.  Not medically stable for discharge at this time.  Will attempt ambulation, therapy evaluations, stress incentive spirometry and flutter valve use.  If we are able to wean to 2 L or less anticipate discharge home on 08/23/2020.   Consultants:   None  Procedures:   None  Antimicrobials:   Remdesivir   Subjective: Patient seen and examined.  Tolerating p.o.  Abdominal pain and nausea improved.  Shortness of breath persistent.  Patient very weak.  Now requiring 3 to 4 L nasal cannula  Objective: Vitals:   08/21/20 0504 08/21/20 1548 08/21/20 2300 08/22/20 0818  BP: (!) 101/58 106/69 103/61 100/60  Pulse: (!) 51 60 61 (!) 55  Resp: 16 18 18 18   Temp: 97.9 F (36.6 C) 98.4  F (36.9 C) 98.6 F (37 C) 98.6 F (37 C)  TempSrc:      SpO2: 93% 97% 98% 95%  Weight:      Height:        Intake/Output Summary (Last 24 hours) at 08/22/2020 1025 Last data filed at 08/22/2020 0900 Gross per 24 hour  Intake --  Output 200 ml  Net -200 ml   Filed Weights   08/18/20 0451 08/19/20 1843  Weight: 72.6 kg 74.6 kg    Examination:  General exam: No  acute distress.  Appears frail Respiratory system: Normal work of breathing.  Bilateral crackles.  3 L  cardiovascular system: S1 & S2 heard, RRR. No JVD, murmurs, rubs, gallops or clicks. No pedal edema. Gastrointestinal system: Abdomen is nondistended, soft and nontender. No organomegaly or masses felt. Normal bowel sounds heard. Central nervous system: Alert and oriented. No focal neurological deficits. Extremities: Symmetric 5 x 5 power. Skin: No rashes, lesions or ulcers Psychiatry: Judgement and insight appear normal. Mood & affect appropriate.     Data Reviewed: I have personally reviewed following labs and imaging studies  CBC: Recent Labs  Lab 08/16/20 2238 08/18/20 0456 08/19/20 0741 08/20/20 0505 08/21/20 0436  WBC 10.7* 10.2 8.4 7.0 8.7  NEUTROABS  --  7.9* 6.1 5.7 7.1  HGB 13.4 13.2 13.2 13.1 13.6  HCT 40.1 39.0 40.0 38.7 39.6  MCV 89.5 89.0 91.1 89.8 88.0  PLT 371 420* 434* 448* 482*   Basic Metabolic Panel: Recent Labs  Lab 08/16/20 2238 08/16/20 2238 08/18/20 0456 08/19/20 0741 08/20/20 0505 08/21/20 0436 08/22/20 0559  NA 138   < > 139 137 138 138 136  K 3.1*   < > 3.2* 3.3* 3.7 3.8 3.9  CL 100   < > 102 102 104 101 101  CO2 25   < > 26 25 26 29 27   GLUCOSE 181*   < > 128* 100* 129* 149* 149*  BUN 14   < > 14 18 22  28* 29*  CREATININE 0.88   < > 0.79 0.89 0.78 0.99 0.82  CALCIUM 8.5*   < > 8.9 8.5* 8.6* 8.7* 8.4*  MG 2.2  --   --   --   --   --   --    < > = values in this interval not displayed.   GFR: Estimated Creatinine Clearance: 60.4 mL/min (by C-G formula based on SCr of 0.82 mg/dL). Liver Function Tests: Recent Labs  Lab 08/18/20 0456  AST 23  ALT 15  ALKPHOS 145*  BILITOT 1.6*  PROT 6.6  ALBUMIN 3.3*   Recent Labs  Lab 08/18/20 0456  LIPASE 33   No results for input(s): AMMONIA in the last 168 hours. Coagulation Profile: No results for input(s): INR, PROTIME in the last 168 hours. Cardiac Enzymes: No results for  input(s): CKTOTAL, CKMB, CKMBINDEX, TROPONINI in the last 168 hours. BNP (last 3 results) No results for input(s): PROBNP in the last 8760 hours. HbA1C: No results for input(s): HGBA1C in the last 72 hours. CBG: No results for input(s): GLUCAP in the last 168 hours. Lipid Profile: No results for input(s): CHOL, HDL, LDLCALC, TRIG, CHOLHDL, LDLDIRECT in the last 72 hours. Thyroid Function Tests: No results for input(s): TSH, T4TOTAL, FREET4, T3FREE, THYROIDAB in the last 72 hours. Anemia Panel: No results for input(s): VITAMINB12, FOLATE, FERRITIN, TIBC, IRON, RETICCTPCT in the last 72 hours. Sepsis Labs: Recent Labs  Lab 08/16/20 2238 08/16/20 2358  LATICACIDVEN 2.3* 1.3  No results found for this or any previous visit (from the past 240 hour(s)).       Radiology Studies: No results found.      Scheduled Meds: . atorvastatin  10 mg Oral QHS  . citric acid-potassium citrate  40 mEq Oral Once  . enoxaparin (LOVENOX) injection  40 mg Subcutaneous Q24H  . mouth rinse  15 mL Mouth Rinse BID  . predniSONE  50 mg Oral Daily  . senna-docusate  1 tablet Oral BID   Continuous Infusions: . sodium chloride Stopped (08/20/20 1300)  . remdesivir 100 mg in NS 100 mL 100 mg (08/22/20 0834)     LOS: 4 days    Time spent: 15 minutes    Tresa Moore, MD [facility] Pager [phone]  If 7PM-7AM, please contact night-coverage 08/22/2020, 10:25 AM

## 2020-08-23 LAB — BASIC METABOLIC PANEL
Anion gap: 7 (ref 5–15)
BUN: 27 mg/dL — ABNORMAL HIGH (ref 8–23)
CO2: 27 mmol/L (ref 22–32)
Calcium: 8.4 mg/dL — ABNORMAL LOW (ref 8.9–10.3)
Chloride: 104 mmol/L (ref 98–111)
Creatinine, Ser: 0.85 mg/dL (ref 0.44–1.00)
GFR, Estimated: >60 mL/min (ref >60)
Glucose, Bld: 90 mg/dL (ref 70–99)
Potassium: 3.7 mmol/L (ref 3.5–5.1)
Sodium: 138 mmol/L (ref 135–145)

## 2020-08-23 LAB — C-REACTIVE PROTEIN: CRP: 0.5 mg/dL (ref <1.0)

## 2020-08-23 LAB — FIBRIN DERIVATIVES D-DIMER (ARMC ONLY): Fibrin derivatives D-dimer (ARMC): 634.3 ng/mL (FEU) — ABNORMAL HIGH (ref 0.00–499.00)

## 2020-08-23 MED ORDER — SODIUM CHLORIDE 0.9 % IV SOLN: 100.0000 mg | Freq: Every day | INTRAVENOUS | Status: DC

## 2020-08-23 MED ORDER — DEXAMETHASONE 4 MG PO TABS
6.0000 mg | ORAL_TABLET | Freq: Every day | ORAL | Status: DC
  Administered 2020-08-23 – 2020-08-24 (×2): 6 mg via ORAL
  Filled 2020-08-23: qty 2

## 2020-08-23 MED ORDER — POLYETHYLENE GLYCOL 3350 17 G PO PACK: 34.0000 g | PACK | Freq: Two times a day (BID) | ORAL | 0 refills | Status: AC

## 2020-08-23 MED ORDER — POLYETHYLENE GLYCOL 3350 17 G PO PACK
34.0000 g | PACK | ORAL | Status: AC
  Administered 2020-08-23 (×5): 34 g via ORAL
  Filled 2020-08-23 (×4): qty 2

## 2020-08-23 NOTE — Evaluation (Signed)
Occupational Therapy Evaluation Patient Details Name: Alexandria Mason MRN: [id number] DOB: 1951-04-20 Today's Date: 08/23/2020    History of Present Illness Pt is a 69 y/o F with PMH: HLD and anemia. Pt presented secondary to 7 to 8 days of SOB and nausea as well as severe fatigue and decreased p.o. intake.  This is in the setting of recent being diagnosed with COVID-19 on 11/23.   Clinical Impression   Pt seem for OT evaluation this date in setting of hospitalization with COVID. Pt was requiring 3-4Lnc, she was on 2Lnc when OT presents. O2 montiroed throughout and pt sustains >90% sats even with activity/mobility, on room air. Pt reports living alone and performing all ADLs/IADLs I'ly at baseline. She has a daughter and neighbors that check on her regularly. She has a [facility] with 4 STE with R railing. Pt requires CGA/SBA with RW for ADL tranfers and fxl mobility this date as well as standing self care d/t some sway/decreased balance and some impulsivity. OT engages pt in below-listed exercises. Overall, pt tolerating well, but does demo some decreased balance and activity tolerance and would benefit from f/u with OT in acute setting as well as in home health setting upon d/c.     Follow Up Recommendations  Home health OT;Supervision - Intermittent (SUPV for OOB/OOC, ADL transfers/fxl mobility)    Equipment Recommendations  Tub/shower seat;Other (comment) (2WW)    Recommendations for Other Services       Precautions / Restrictions Precautions Precautions: Fall Restrictions Weight Bearing Restrictions: No      Mobility Bed Mobility Overal bed mobility: Modified Independent             General bed mobility comments: increased time    Transfers Overall transfer level: Needs assistance Equipment used: Rolling walker (2 wheeled) Transfers: Sit to/from [employer] Sit to Stand: Supervision;Min guard Stand pivot transfers: Supervision;Min guard       General  transfer comment: cues for safe hand placement with RW and SBA/CGA d/t some impulsivity and some decreased balance    Balance Overall balance assessment: Needs assistance   Sitting balance-Leahy Scale: Normal       Standing balance-Leahy Scale: Fair Standing balance comment: benefits from UE support, SBA to CGA with static standing. Requires increased assist with more dynamic standing tasks: ~MIN A                           ADL either performed or assessed with clinical judgement   ADL Overall ADL's : Needs assistance/impaired                                       General ADL Comments: requires SBA/CGA with RW for standing ADLs sink-side d/t some instability/sway. Able to perform all seated ADLs with SETUP/INDEP including UB/LB.     Vision Patient Visual Report: No change from baseline Additional Comments: decreased vision R eye at baseline     Perception     Praxis      Pertinent Vitals/Pain Pain Assessment: No/denies pain     Hand Dominance     Extremity/Trunk Assessment Upper Extremity Assessment Upper Extremity Assessment: Overall WFL for tasks assessed   Lower Extremity Assessment Lower Extremity Assessment: Overall WFL for tasks assessed;Generalized weakness (ROM WFL, somewhat wobbly with fxl mobility)   Cervical / Trunk Assessment Cervical / Trunk Assessment: Normal   Communication  Communication Communication: No difficulties   Cognition Arousal/Alertness: Awake/alert Behavior During Therapy: WFL for tasks assessed/performed;Impulsive Overall Cognitive Status: Within Functional Limits for tasks assessed                                 General Comments: somewhat impulsive and repeats herself often, but able to follow all commands and is oriented.   General Comments  pt with 2Lnc when OT presents, satting 94-98% w/ and w/o activity. Pt tested on RA and able to sustain 90-93% both at rest and with activity. Pt left  on RA, monitor re-connected for safety and RN/CNA notified.    Exercises Other Exercises Other Exercises: OT facilitates ed re: role of OT in acute setting, benefits of OOB activity, safety considerations including safe use of RW, use of call bell and notified of chair alarm placement. In addition, pt educated re: correct use of IS as she is noted to be puffing very quickly and only minimall elevating dial, educated to slow down and focus on quality-spread out 10 puffs/hour. Pt with good understanding. Other Exercises: OT engages pt in chair push ups for 1set x15 reps, MIP x15 alternating, straigh arm raises for 1 set x15 reps. Pt tolerates well, O2 monitored throughout and stays >90% even when titrated to RA.   Shoulder Instructions      Home Living Family/patient expects to be discharged to:: Private residence Living Arrangements: Alone Available Help at Discharge: Family;Neighbor;Available PRN/intermittently (dtr lives 20 mins away and neighbors check on her regularly) Type of Home: House Home Access: Stairs to enter Secretary/administrator of Steps: 4 Entrance Stairs-Rails: Right Home Layout: One level     Bathroom Shower/Tub: Tub/shower unit         Home Equipment: None          Prior Functioning/Environment Level of Independence: Independent        Comments: Pt reports driving short distances and never at night, gets own groceries, performs all self care and fxl mobility I'ly with no AD. States she might have some equipment from her mother at home, but not sure what.        OT Problem List: Decreased strength;Decreased activity tolerance;Decreased safety awareness;Decreased knowledge of use of DME or AE;Cardiopulmonary status limiting activity      OT Treatment/Interventions: Self-care/ADL training;DME and/or AE instruction;Therapeutic activities;Balance training;Therapeutic exercise;Energy conservation;Patient/family education    OT Goals(Current goals can be found  in the care plan section) Acute Rehab OT Goals Patient Stated Goal: to get well and be able to be on my own OT Goal Formulation: With patient/family (dtr via telephone) Time For Goal Achievement: 09/06/20 Potential to Achieve Goals: Good ADL Goals Pt Will Perform Lower Body Dressing: with modified independence;sit to/from stand (with LRAD PRN and good safety awareness) Pt Will Transfer to Toilet: with modified independence;ambulating;grab bars (with LRAD PRN to restroom) Pt Will Perform Toileting - Clothing Manipulation and hygiene: with modified independence;sit to/from stand (with good safety awareness) Pt/caregiver will Perform Home Exercise Program: Increased strength;Both right and left upper extremity;With Supervision  OT Frequency: Min 1X/week   Barriers to D/C:            Co-evaluation              AM-PAC OT "6 Clicks" Daily Activity     Outcome Measure Help from another person eating meals?: None Help from another person taking care of personal grooming?: None Help from another  person toileting, which includes using toliet, bedpan, or urinal?: A Little Help from another person bathing (including washing, rinsing, drying)?: A Little Help from another person to put on and taking off regular upper body clothing?: None Help from another person to put on and taking off regular lower body clothing?: A Little 6 Click Score: 21   End of Session Equipment Utilized During Treatment: Gait belt;Rolling walker Nurse Communication: Mobility status;Other (comment) (notfiied RN/CNA that pt left on RA with O2 monitor re-connected. And notified that pt bathed with OT during session)  Activity Tolerance: Patient tolerated treatment well Patient left: in chair;with call bell/phone within reach;with chair alarm set  OT Visit Diagnosis: Unsteadiness on feet (R26.81);Muscle weakness (generalized) (M62.81)                Time: 9892-1194 OT Time Calculation (min): 57 min Charges:  OT  General Charges $OT Visit: 1 Visit OT Evaluation $OT Eval Moderate Complexity: 1 Mod OT Treatments $Self Care/Home Management : 23-37 mins $Therapeutic Activity: 8-22 mins $Therapeutic Exercise: 8-22 mins  Rejeana Brock, MS, OTR/L ascom [phone] 08/23/20, 12:24 PM

## 2020-08-23 NOTE — Progress Notes (Signed)
PT Cancellation Note  Patient Details Name: Alexandria Mason MRN: [id number] DOB: 25-Sep-1950   Cancelled Treatment:    Reason Eval/Treat Not Completed: Other (comment): PT eval attempted with pt found to just have received hot dinner, will attempt PT eval tomorrow AM as medically appropriate.     Ovidio Hanger PT, DPT 08/23/20, 4:46 PM

## 2020-08-23 NOTE — TOC Progression Note (Signed)
Transition of Care [facility]) - Progression Note    Patient Details  Name: Alexandria Mason MRN: [id number] Date of Birth: September 14, 1951  Transition of Care [facility]) CM/SW Contact  Allayne Butcher, RN Phone Number: 08/23/2020, 4:36 PM  Clinical Narrative:    RNCM spoke with patient's daughter, Alexandria Mason, again this afternoon to update on potential discharge tomorrow.  Daughter just wants to ensure that the patient can get back and forth to the bathroom by herself and to the kitchen to get food if needed.  Daughter is supportive but she will not be with the patient 24/7.  PT will be working with patient, TOC will follow up with patient and daughter after PT eval.    Expected Discharge Plan: Home w Home Health Services Barriers to Discharge: Continued Medical Work up  Expected Discharge Plan and Services Expected Discharge Plan: Home w Home Health Services   Discharge Planning Services: CM Consult Post Acute Care Choice: Home Health Living arrangements for the past 2 months: Single Family Home Expected Discharge Date: 08/23/20               DME Arranged: Dan Humphreys rolling DME Agency: [facility]       HH Arranged: RN, PT, OT HH Agency: [facility] Date HH Agency Contacted: 08/21/20 Time HH Agency Contacted: 1508 Representative spoke with at [facility] Agency: Feliberto Gottron   Social Determinants of Health (SDOH) Interventions    Readmission Risk Interventions No flowsheet data found.

## 2020-08-23 NOTE — Care Management Important Message (Signed)
Important Message  Patient Details  Name: Alexandria Mason MRN: [id number] Date of Birth: 1950-09-23   Medicare Important Message Given:  Yes     Allayne Butcher, RN 08/23/2020, 11:30 AM

## 2020-08-23 NOTE — TOC Progression Note (Signed)
Transition of Care [facility]) - Progression Note    Patient Details  Name: Alexandria Mason MRN: [id number] Date of Birth: June 06, 1951  Transition of Care [facility]) CM/SW Contact  Allayne Butcher, RN Phone Number: 08/23/2020, 2:25 PM  Clinical Narrative:    DC canceled for today, potential discharge for tomorrow.  Patient will be [facility] RN, PT, and OT. Patient's daughter will pick her up at discharge and patient may even go and stay with her daughter in [place] for a short time until she is stronger.  TOC to follow up tomorrow.  Expected Discharge Plan: Home w Home Health Services Barriers to Discharge: Continued Medical Work up  Expected Discharge Plan and Services Expected Discharge Plan: Home w Home Health Services   Discharge Planning Services: CM Consult Post Acute Care Choice: Home Health Living arrangements for the past 2 months: Single Family Home Expected Discharge Date: 08/23/20               DME Arranged: Dan Humphreys rolling DME Agency: [facility]       HH Arranged: RN, PT, OT HH Agency: [facility] Date HH Agency Contacted: 08/21/20 Time HH Agency Contacted: 1508 Representative spoke with at [facility] Agency: Feliberto Gottron   Social Determinants of Health (SDOH) Interventions    Readmission Risk Interventions No flowsheet data found.

## 2020-08-23 NOTE — Progress Notes (Signed)
PROGRESS NOTE    Alexandria Mason  JXB:[id number] DOB: 09/02/1951 DOA: 08/18/2020 PCP: Lynnda Child, MD   Brief Narrative:   69 y.o. female with medical history significant for hyperlipidemia presented to the ED for chief concern of nausea, vomiting, diarrhea that started about 10 days ago.   She was tested positive for covid on 08/08/20.  She is unvaccinated for COVID-19 infection.  Patient reports that she has tried to maintain hydration with p.o. intake of water however she is not able to keep anything down.  She endorses monoclonal antibody infusion on 08/12/2020 without improvement of symptoms. 12/4: Presentation patient was requiring 3 L supplemental oxygen. She has been weaned off however her symptoms of nausea vomiting and diarrhea persisted. 12/5: Back on oxygen, 3 L to maintain saturations greater than 88%.  Still feels weak but improving over interval. 12/6: On 2 L this morning.  Respiratory status improving however patient significantly weak and debilitated.  Gets winded with minimal exertion 12/7: On 3 to 4 L as of this morning.  Clinically appears improved however patient significantly weak and debilitated.    Assessment & Plan:   Principal Problem:   Acute hypoxemic respiratory failure due to COVID-19 [facility]) Active Problems:   Anxiety   HLD (hyperlipidemia)   Gastroenteritis due to COVID-19 virus   Hypokalemia  Multifocal pneumonia secondary to COVID-19 Acute hypoxic respiratory failure secondary to above nausea vomiting and diarrhea, presumably secondary to above Patient is unvaccinated She received intravenous steroids and antiemetics in ED Respiratory status and GI symptoms improving over interval.  Already weaned to room air. Plan: --cont Remdesivir --taper steroid to oral decadron today  Hypokalemia Likely secondary to GI losses --monitor and replete PRN  Hyperlipidemia --cont home statin  Constipation --High-dose Miralax 34g q2h for 5  doses    DVT prophylaxis: Lovenox Code Status: Full Family Communication:  Disposition Plan: Status is: Inpatient   Dispo: The patient is from: Home              Anticipated d/c is to: Home              Anticipated d/c date is: tomorrow.  Pt said she was too weak to go home today, but agreed to be discharged tomorrow morning.   Consultants:   None  Procedures:   None  Antimicrobials:   Remdesivir   Subjective: Respiratory status improved, pt able to walk with PT in room air with good O2 saturation.  A bit wobbly but able to walk >40 feet.  Pt reported feeling too weak to go home today.  Still feeling constipated.    Later, pt declined PT attempt in favor of eating her meal hot.   Objective: Vitals:   08/22/20 1617 08/22/20 2056 08/23/20 1219 08/23/20 1652  BP: 116/66 107/64 (!) 97/52 (!) 103/56  Pulse: 64 62 67 69  Resp: 18 16 17 18   Temp: 98.4 F (36.9 C) 98.4 F (36.9 C) 98 F (36.7 C) 98.1 F (36.7 C)  TempSrc:  Oral Oral Oral  SpO2: 97% 97% 98% 95%  Weight:      Height:        Intake/Output Summary (Last 24 hours) at 08/23/2020 1825 Last data filed at 08/22/2020 1826 Gross per 24 hour  Intake 120 ml  Output --  Net 120 ml   Filed Weights   08/18/20 0451 08/19/20 1843  Weight: 72.6 kg 74.6 kg    Examination:  Constitutional: NAD, AAOx3 HEENT: conjunctivae and lids normal, EOMI CV:  No cyanosis.   RESP: no distress, on RA Extremities: No effusions, edema in BLE SKIN: warm, dry and intact Neuro: II - XII grossly intact.   Psych: Normal mood and affect.     Data Reviewed: I have personally reviewed following labs and imaging studies  CBC: Recent Labs  Lab 08/16/20 2238 08/18/20 0456 08/19/20 0741 08/20/20 0505 08/21/20 0436  WBC 10.7* 10.2 8.4 7.0 8.7  NEUTROABS  --  7.9* 6.1 5.7 7.1  HGB 13.4 13.2 13.2 13.1 13.6  HCT 40.1 39.0 40.0 38.7 39.6  MCV 89.5 89.0 91.1 89.8 88.0  PLT 371 420* 434* 448* 482*   Basic Metabolic  Panel: Recent Labs  Lab 08/16/20 2238 08/18/20 0456 08/19/20 0741 08/20/20 0505 08/21/20 0436 08/22/20 0559 08/23/20 0511  NA 138   < > 137 138 138 136 138  K 3.1*   < > 3.3* 3.7 3.8 3.9 3.7  CL 100   < > 102 104 101 101 104  CO2 25   < > 25 26 29 27 27   GLUCOSE 181*   < > 100* 129* 149* 149* 90  BUN 14   < > 18 22 28* 29* 27*  CREATININE 0.88   < > 0.89 0.78 0.99 0.82 0.85  CALCIUM 8.5*   < > 8.5* 8.6* 8.7* 8.4* 8.4*  MG 2.2  --   --   --   --   --   --    < > = values in this interval not displayed.   GFR: Estimated Creatinine Clearance: 58.2 mL/min (by C-G formula based on SCr of 0.85 mg/dL). Liver Function Tests: Recent Labs  Lab 08/18/20 0456  AST 23  ALT 15  ALKPHOS 145*  BILITOT 1.6*  PROT 6.6  ALBUMIN 3.3*   Recent Labs  Lab 08/18/20 0456  LIPASE 33   No results for input(s): AMMONIA in the last 168 hours. Coagulation Profile: No results for input(s): INR, PROTIME in the last 168 hours. Cardiac Enzymes: No results for input(s): CKTOTAL, CKMB, CKMBINDEX, TROPONINI in the last 168 hours. BNP (last 3 results) No results for input(s): PROBNP in the last 8760 hours. HbA1C: No results for input(s): HGBA1C in the last 72 hours. CBG: No results for input(s): GLUCAP in the last 168 hours. Lipid Profile: No results for input(s): CHOL, HDL, LDLCALC, TRIG, CHOLHDL, LDLDIRECT in the last 72 hours. Thyroid Function Tests: No results for input(s): TSH, T4TOTAL, FREET4, T3FREE, THYROIDAB in the last 72 hours. Anemia Panel: No results for input(s): VITAMINB12, FOLATE, FERRITIN, TIBC, IRON, RETICCTPCT in the last 72 hours. Sepsis Labs: Recent Labs  Lab 08/16/20 2238 08/16/20 2358  LATICACIDVEN 2.3* 1.3    No results found for this or any previous visit (from the past 240 hour(s)).       Radiology Studies: No results found.      Scheduled Meds: . atorvastatin  10 mg Oral QHS  . citric acid-potassium citrate  40 mEq Oral Once  . dexamethasone  6 mg  Oral Daily  . enoxaparin (LOVENOX) injection  40 mg Subcutaneous Q24H  . mouth rinse  15 mL Mouth Rinse BID  . polyethylene glycol  34 g Oral Q2H   Continuous Infusions: . sodium chloride 250 mL (08/23/20 1106)     LOS: 5 days     14/08/21, MD [facility] Pager [phone]  If 7PM-7AM, please contact night-coverage 08/23/2020, 6:25 PM

## 2020-08-24 NOTE — Discharge Summary (Signed)
Physician Discharge Summary   Alexandria Mason  female DOB: 12/07/1950  DJM:[id number]  PCP: Lynnda Child, MD  Admit date: 08/18/2020 Discharge date: 08/24/2020  Admitted From: home Disposition:  home Home Health: Yes CODE STATUS: Full code  Discharge Instructions    Discharge instructions   Complete by: As directed    You have finished your COVID treatment and improved, and no longer need oxygen, so you can go home to continue to recover.   Dr. Darlin Priestly [facility] Course:  For full details, please see H&P, progress notes, consult notes and ancillary notes.  Briefly,  Alexandria Mason is a 69 y.o.femalewith medical history significant forhyperlipidemia presented to the ED for chief concern ofnausea, vomiting, diarrheathat started about 10 days ago.   She was tested positive for covid on 08/08/20.She is unvaccinated for COVID-19 infection. She endorsed monoclonal antibody infusion on 11/27/2021without improvement of symptoms.  # Multifocal pneumonia secondary to COVID-19 # Acute hypoxic respiratory failure secondary to above # nausea vomiting and diarrhea, secondary to above On presentation, pt was requiring 3L O2, however, was weaned to room air prior to discharge.  Pt completed 5 days of Remdesivir.  Pt was started on solumedrol on admission and tapered to oral decadron, however, CRP was never elevated, so pt was not discharged on steroid.  Pt was tolerating oral intake prior to discharge.  Hypokalemia Likely secondary to GI losses.   Potassium level was monitored and repleted PRN.  Hyperlipidemia Continued home statin  Constipation Large BM achieved after high-dose Miralax 34g q2h for 5 doses   Discharge Diagnoses:  Principal Problem:   Acute hypoxemic respiratory failure due to COVID-19 [facility]) Active Problems:   Anxiety   HLD (hyperlipidemia)   Gastroenteritis due to COVID-19 virus   Hypokalemia    Discharge  Instructions:  Allergies as of 08/24/2020      Reactions   Oxycodone-acetaminophen    Hallucinations   Sulfa Antibiotics    Upset stomach      Medication List    STOP taking these medications   azithromycin 250 MG tablet Commonly known as: ZITHROMAX     TAKE these medications   Aleve 220 MG Caps Generic drug: Naproxen Sodium Take by mouth daily as needed.   atorvastatin 10 MG tablet Commonly known as: LIPITOR TAKE 1 TABLET BY MOUTH EVERY DAY   benzonatate 100 MG capsule Commonly known as: Tessalon Perles Take 1 capsule (100 mg total) by mouth 3 (three) times daily as needed for cough.   ondansetron 4 MG tablet Commonly known as: Zofran Take 1 tablet (4 mg total) by mouth every 8 (eight) hours as needed for up to 10 doses for nausea or vomiting.   polyethylene glycol 17 g packet Commonly known as: MIRALAX / GLYCOLAX Take 34 g by mouth 2 (two) times daily for 5 days.            Durable Medical Equipment  (From admission, onward)         Start     Ordered   08/21/20 1514  For home use only DME Walker rolling  Once       Question Answer Comment  Walker: With 5 Inch Wheels   Patient needs a walker to treat with the following condition COVID-19   Patient needs a walker to treat with the following condition Weakness generalized      08/21/20 1513           Follow-up  Information    [facility] EMERGENCY DEPARTMENT.   Specialty: Emergency Medicine Why: If symptoms worsen Contact information: [address] [id number] [place] [address] [phone]       Lynnda Child, MD. Go on 09/06/2020.   Specialty: Family Medicine Why: virtual appointment at 11:20am Contact information: [address] [place] [address] [phone]               Allergies  Allergen Reactions  . Oxycodone-Acetaminophen     Hallucinations  . Sulfa Antibiotics     Upset stomach     The results of significant  diagnostics from this hospitalization (including imaging, microbiology, ancillary and laboratory) are listed below for reference.   Consultations:   Procedures/Studies: DG Chest Port 1 View  Result Date: 08/18/2020 CLINICAL DATA:  COVID-19 positive, nausea and vomiting EXAM: PORTABLE CHEST 1 VIEW COMPARISON:  08/16/2020 chest radiograph. FINDINGS: Stable cardiomediastinal silhouette with top-normal heart size. No pneumothorax. No pleural effusion. Moderate patchy opacities throughout both lungs, predominantly peripheral, stable to slightly worsened. IMPRESSION: Moderate patchy opacities throughout both lungs, stable to slightly worsened, compatible with COVID-19 pneumonia. Electronically Signed   By: Delbert Phenix M.D.   On: 08/18/2020 12:49   DG Chest Portable 1 View  Result Date: 08/16/2020 CLINICAL DATA:  COVID-19 positive, weakness, nausea and vomiting EXAM: PORTABLE CHEST 1 VIEW COMPARISON:  None. FINDINGS: Single frontal view of the chest demonstrates an unremarkable cardiac silhouette. Diffuse interstitial prominence with scattered bilateral areas of airspace disease. No effusion or pneumothorax. No acute bony abnormality. IMPRESSION: 1. Findings consistent with multifocal COVID-19 pneumonia. Electronically Signed   By: Sharlet Salina M.D.   On: 08/16/2020 23:45      Labs: BNP (last 3 results) Recent Labs    08/16/20 2238  BNP 56.2   Basic Metabolic Panel: Recent Labs  Lab 08/19/20 0741 08/20/20 0505 08/21/20 0436 08/22/20 0559 08/23/20 0511  NA 137 138 138 136 138  K 3.3* 3.7 3.8 3.9 3.7  CL 102 104 101 101 104  CO2 25 26 29 27 27   GLUCOSE 100* 129* 149* 149* 90  BUN 18 22 28* 29* 27*  CREATININE 0.89 0.78 0.99 0.82 0.85  CALCIUM 8.5* 8.6* 8.7* 8.4* 8.4*   Liver Function Tests: Recent Labs  Lab 08/18/20 0456  AST 23  ALT 15  ALKPHOS 145*  BILITOT 1.6*  PROT 6.6  ALBUMIN 3.3*   Recent Labs  Lab 08/18/20 0456  LIPASE 33   No results for input(s): AMMONIA  in the last 168 hours. CBC: Recent Labs  Lab 08/18/20 0456 08/19/20 0741 08/20/20 0505 08/21/20 0436  WBC 10.2 8.4 7.0 8.7  NEUTROABS 7.9* 6.1 5.7 7.1  HGB 13.2 13.2 13.1 13.6  HCT 39.0 40.0 38.7 39.6  MCV 89.0 91.1 89.8 88.0  PLT 420* 434* 448* 482*   Cardiac Enzymes: No results for input(s): CKTOTAL, CKMB, CKMBINDEX, TROPONINI in the last 168 hours. BNP: Invalid input(s): POCBNP CBG: No results for input(s): GLUCAP in the last 168 hours. D-Dimer No results for input(s): DDIMER in the last 72 hours. Hgb A1c No results for input(s): HGBA1C in the last 72 hours. Lipid Profile No results for input(s): CHOL, HDL, LDLCALC, TRIG, CHOLHDL, LDLDIRECT in the last 72 hours. Thyroid function studies No results for input(s): TSH, T4TOTAL, T3FREE, THYROIDAB in the last 72 hours.  Invalid input(s): FREET3 Anemia work up No results for input(s): VITAMINB12, FOLATE, FERRITIN, TIBC, IRON, RETICCTPCT in the last 72 hours. Urinalysis  Component Value Date/Time   COLORURINE AMBER (A) 08/18/2020 0526   APPEARANCEUR HAZY (A) 08/18/2020 0526   LABSPEC 1.017 08/18/2020 0526   PHURINE 6.0 08/18/2020 0526   GLUCOSEU NEGATIVE 08/18/2020 0526   HGBUR NEGATIVE 08/18/2020 0526   BILIRUBINUR NEGATIVE 08/18/2020 0526   KETONESUR 5 (A) 08/18/2020 0526   PROTEINUR NEGATIVE 08/18/2020 0526   NITRITE NEGATIVE 08/18/2020 0526   LEUKOCYTESUR SMALL (A) 08/18/2020 0526   Sepsis Labs Invalid input(s): PROCALCITONIN,  WBC,  LACTICIDVEN Microbiology No results found for this or any previous visit (from the past 240 hour(s)).   Total time spend on discharging this patient, including the last patient exam, discussing the hospital stay, instructions for ongoing care as it relates to all pertinent caregivers, as well as preparing the medical discharge records, prescriptions, and/or referrals as applicable, is 35 minutes.    Darlin Priestly, MD  [facility] 08/24/2020, 9:06 AM

## 2020-08-24 NOTE — Evaluation (Signed)
Physical Therapy Evaluation Patient Details Name: Alexandria Mason MRN: [id number] DOB: 03-Jul-1951 Today's Date: 08/24/2020   History of Present Illness  Pt is a 69 y/o F with PMH: HLD and anemia. Pt presented secondary to 7 to 8 days of SOB and nausea as well as severe fatigue and decreased p.o. intake.  This is in the setting of recent being diagnosed with COVID-19 on 11/23.    Clinical Impression  Pt was pleasant and motivated to participate during the session.  Pt taken through graded therex and then ambulated 40 feet followed by 80 feet with SpO2 measured frequently and remaining >/= 96% throughout on room air.  Pt was steady with transfers and demonstrated good eccentric and concentric control and stability.  During ambulation with a RW pt ambulated with a slow cadence with very mild drifting left/right and reported subjectively that she felt weaker than at baseline and did not feel confident enough to try ambulation without the RW.  Pt will benefit from HHPT services upon discharge to safely address deficits listed in patient problem list for decreased caregiver assistance and eventual return to PLOF.      Follow Up Recommendations Home health PT;Supervision for mobility/OOB    Equipment Recommendations  Rolling walker with 5" wheels    Recommendations for Other Services       Precautions / Restrictions Precautions Precautions: Fall Restrictions Weight Bearing Restrictions: No      Mobility  Bed Mobility Overal bed mobility: Modified Independent             General bed mobility comments: increased time but no physical assistance needed    Transfers Overall transfer level: Needs assistance Equipment used: Rolling walker (2 wheeled) Transfers: Sit to/from Stand Sit to Stand: Supervision         General transfer comment: Good eccentric and concentric control and stability  Ambulation/Gait Ambulation/Gait assistance: Supervision Gait Distance (Feet): 80 Feet x  1, 40 Feet x 1 Assistive device: Rolling walker (2 wheeled) Gait Pattern/deviations: Step-through pattern;Decreased step length - right;Decreased step length - left Gait velocity: decreased   General Gait Details: Mildly decreased cadence with min drifting left/right with RW but steady without LOB; pt reported feeling subjectively weaker than at baseline and declined amb attempt without use of an AD  Stairs Stairs:  (Unable to attempt secondary to covid isolation room)          Wheelchair Mobility    Modified Rankin (Stroke Patients Only)       Balance Overall balance assessment: Needs assistance   Sitting balance-Leahy Scale: Normal     Standing balance support: Bilateral upper extremity supported Standing balance-Leahy Scale: Good                               Pertinent Vitals/Pain Pain Assessment: No/denies pain    Home Living Family/patient expects to be discharged to:: Private residence Living Arrangements: Alone Available Help at Discharge: Family;Neighbor;Available PRN/intermittently Type of Home: House Home Access: Stairs to enter Entrance Stairs-Rails: Right Entrance Stairs-Number of Steps: 4 Home Layout: One level Home Equipment: None      Prior Function Level of Independence: Independent         Comments: Pt reports driving short distances and never at night, gets own groceries, Ind with ADLs, community ambulator without an AD, no recent fall history, walks for exercise 2x/day around a mile total     Hand Dominance  Extremity/Trunk Assessment   Upper Extremity Assessment Upper Extremity Assessment: Overall WFL for tasks assessed    Lower Extremity Assessment Lower Extremity Assessment: Generalized weakness    Cervical / Trunk Assessment Cervical / Trunk Assessment: Normal  Communication   Communication: No difficulties  Cognition Arousal/Alertness: Awake/alert Behavior During Therapy: WFL for tasks  assessed/performed Overall Cognitive Status: Within Functional Limits for tasks assessed                                        General Comments      Exercises Total Joint Exercises Ankle Circles/Pumps: Strengthening;Both;5 reps;10 reps Quad Sets: Strengthening;Both;5 reps;10 reps Gluteal Sets: Strengthening;Both;5 reps;10 reps Heel Slides: Strengthening;Both;10 reps Hip ABduction/ADduction: Strengthening;Both;10 reps Straight Leg Raises: Strengthening;Both;10 reps Long Arc Quad: AROM;Strengthening;Both;10 reps Other Exercises Other Exercises: Pt taken through graded therex with SpO2 remaining >/= 96% throughout Other Exercises: Pt education provided on amb closer to the RW with upright posture with SpO2 remaining >/= 96% throughout the session on room air   Assessment/Plan    PT Assessment Patient needs continued PT services  PT Problem List Decreased strength;Decreased activity tolerance;Decreased balance;Decreased mobility;Decreased knowledge of use of DME       PT Treatment Interventions DME instruction;Gait training;Stair training;Functional mobility training;Therapeutic activities;Therapeutic exercise;Balance training;Patient/family education    PT Goals (Current goals can be found in the Care Plan section)  Acute Rehab PT Goals Patient Stated Goal: To get back to walking better and be able to go shopping PT Goal Formulation: With patient Time For Goal Achievement: 09/06/20 Potential to Achieve Goals: Good    Frequency Min 2X/week   Barriers to discharge        Co-evaluation               AM-PAC PT "6 Clicks" Mobility  Outcome Measure Help needed turning from your back to your side while in a flat bed without using bedrails?: None Help needed moving from lying on your back to sitting on the side of a flat bed without using bedrails?: None Help needed moving to and from a bed to a chair (including a wheelchair)?: None Help needed standing  up from a chair using your arms (e.g., wheelchair or bedside chair)?: A Little Help needed to walk in hospital room?: A Little Help needed climbing 3-5 steps with a railing? : A Little 6 Click Score: 21    End of Session Equipment Utilized During Treatment: Gait belt Activity Tolerance: Patient tolerated treatment well Patient left: in bed;in chair;with nursing/sitter in room;with call bell/phone within reach Nurse Communication: Mobility status PT Visit Diagnosis: Unsteadiness on feet (R26.81);Difficulty in walking, not elsewhere classified (R26.2);Muscle weakness (generalized) (M62.81)    Time: 7494-4967 PT Time Calculation (min) (ACUTE ONLY): 41 min   Charges:   PT Evaluation $PT Eval Moderate Complexity: 1 Mod PT Treatments $Therapeutic Exercise: 8-22 mins $Therapeutic Activity: 8-22 mins        D. Scott Althea Backs PT, DPT 08/24/20, 11:29 AM

## 2020-08-24 NOTE — TOC Transition Note (Signed)
Transition of Care [facility]) - CM/SW Discharge Note   Patient Details  Name: Alexandria Mason MRN: [id number] Date of Birth: 1951/09/11  Transition of Care [facility]) CM/SW Contact:  Allayne Butcher, RN Phone Number: 08/24/2020, 9:26 AM   Clinical Narrative:    Patient is medically cleared for discharge home with home health services through [facility].  Barbara Cower with [facility] aware of discharge today.  Rolling walker delivered to the room yesterday and 3 in 1 to be delivered today before discharge.  Patient's daughter will be able to pick her up.    Final next level of care: Home w Home Health Services Barriers to Discharge: Barriers Resolved   Patient Goals and CMS Choice Patient states their goals for this hospitalization and ongoing recovery are:: Patient reports she will do what she needs to to get home CMS Medicare.gov Compare Post Acute Care list provided to:: Patient Choice offered to / list presented to : Patient  Discharge Placement                       Discharge Plan and Services   Discharge Planning Services: CM Consult Post Acute Care Choice: Home Health          DME Arranged: 3-N-1,Walker rolling DME Agency: [facility] Date DME Agency Contacted: 08/24/20 Time DME Agency Contacted: [phone] Representative spoke with at DME Agency: Elease Hashimoto HH Arranged: RN,PT,OT HH Agency: [facility] Date HH Agency Contacted: 08/24/20 Time HH Agency Contacted: [phone] Representative spoke with at [facility] Agency: Barbara Cower  Social Determinants of Health (SDOH) Interventions     Readmission Risk Interventions No flowsheet data found.

## 2020-08-25 ENCOUNTER — Telehealth: Payer: Self-pay

## 2020-08-25 NOTE — Telephone Encounter (Signed)
Transition Care Management Unsuccessful Follow-up Telephone Call  Date of discharge and from where:  08/24/2020, [facility]  Attempts:  2nd Attempt  Reason for unsuccessful TCM follow-up call:  No answer/busy

## 2020-08-25 NOTE — Telephone Encounter (Signed)
Transition Care Management Unsuccessful Follow-up Telephone Call  Date of discharge and from where:  08/24/2020, [facility]  Attempts:  1st Attempt  Reason for unsuccessful TCM follow-up call:  Left voice message

## 2020-08-25 NOTE — Telephone Encounter (Signed)
Transition Care Management Unsuccessful Follow-up Telephone Call  Date of discharge and from where:  08/24/2020, [facility]  Attempts:  3rd Attempt  Reason for unsuccessful TCM follow-up call:  Left voice message

## 2020-08-29 ENCOUNTER — Telehealth: Payer: Self-pay | Admitting: Family Medicine

## 2020-08-29 NOTE — Telephone Encounter (Signed)
Ok for orders per request.

## 2020-08-29 NOTE — Telephone Encounter (Signed)
Lawson Fiscal from [facility] called to request Nursing Orders. 1x w 4, 1 every other week for 5 weeks. 2 PRN. She can be reached at [phone] and states it is a secure voicemail if she does not answer.

## 2020-08-29 NOTE — Telephone Encounter (Signed)
Spoke to Nuevo at [facility] [employer] [facility] [facility] and gave verbal orders, per Dr. [name], for nursing 1 x a week/4 weeks, 1 every other week x 5 weeks and 2 PRN visits.

## 2020-09-06 ENCOUNTER — Telehealth: Payer: Medicare Other | Admitting: Family Medicine

## 2020-09-06 ENCOUNTER — Other Ambulatory Visit: Payer: Self-pay

## 2020-09-18 ENCOUNTER — Ambulatory Visit: Payer: Medicare Other | Admitting: Family Medicine

## 2020-09-18 ENCOUNTER — Telehealth: Payer: Self-pay

## 2020-09-18 NOTE — Telephone Encounter (Signed)
Noted, no late fee.

## 2020-09-18 NOTE — Telephone Encounter (Signed)
[place] [facility] Night - Client Nonclinical Telephone Record AccessNurse Client [place] [facility] Night - Client Client Site [place] [facility] [place] - Night Physician Gweneth Dimitri- MD Contact Type Call Who Is Calling Patient / Member / Family / Caregiver Caller Name Reizy Dunlow Caller Phone Number [phone] Patient Name Alexandria Mason Patient DOB 09-06-1951 Call Type Message Only Information Provided Reason for Call Request to [facility] Appointment Initial Comment Caller states she wants to cancel her appointment for tomorrow due a winter storm warning. Additional Comment Caller declined triage, provided office hours to caller. Disp. Time Disposition Final User 09/17/2020 8:15:10 AM General Information Provided Yes Mockler, Tiffany Call Closed By: Velta Addison Transaction Date/Time: 09/17/2020 8:11:37 AM (ET)

## 2021-02-07 ENCOUNTER — Other Ambulatory Visit: Payer: Self-pay | Admitting: Family Medicine

## 2021-02-07 DIAGNOSIS — E785 Hyperlipidemia, unspecified: Secondary | ICD-10-CM

## 2021-02-07 NOTE — Telephone Encounter (Signed)
Will provide 90 day refill. Pt needs Medicare wellness as soon as possible, please.

## 2021-02-13 NOTE — Telephone Encounter (Signed)
LVMTCB to schedule AWV

## 2021-05-03 ENCOUNTER — Other Ambulatory Visit: Payer: Self-pay | Admitting: Family Medicine

## 2021-05-03 DIAGNOSIS — E785 Hyperlipidemia, unspecified: Secondary | ICD-10-CM

## 2021-05-04 NOTE — Telephone Encounter (Signed)
Called patient no answer . Left voice message to call the office

## 2021-05-07 NOTE — Telephone Encounter (Signed)
LMTCB and schedule appt.

## 2021-05-08 NOTE — Telephone Encounter (Signed)
[place] [facility] Night - Client Nonclinical Telephone Record AccessNurse Client [place] [facility] Night - Client Client Site [place] [facility] [place] - Night Contact Type Call Who Is Calling Patient / Member / Family / Caregiver Caller Name Mattingly Fountaine Caller Phone Number [phone] Call Type Message Only Information Provided Reason for Call Returning a Call from the Office Initial Comment Caller states she is returning a call. Additional Comment Hours were provided Disp. Time Disposition Final User 05/07/2021 5:02:27 PM General Information Provided Yes Jiles Garter Call Closed By: Jiles Garter Transaction Date/Time: 05/07/2021 5:00:28 PM (ET)

## 2021-05-08 NOTE — Telephone Encounter (Signed)
LMTCB to schedule.

## 2021-05-08 NOTE — Telephone Encounter (Signed)
Pt called back she is schedule for 9/20

## 2021-05-22 ENCOUNTER — Other Ambulatory Visit: Payer: Self-pay | Admitting: Family Medicine

## 2021-05-22 DIAGNOSIS — E785 Hyperlipidemia, unspecified: Secondary | ICD-10-CM

## 2021-05-28 ENCOUNTER — Telehealth: Payer: Self-pay | Admitting: Family Medicine

## 2021-05-28 NOTE — Telephone Encounter (Signed)
LVM for pt to rtn my call to schedule AWV with [facility].  

## 2021-06-05 ENCOUNTER — Ambulatory Visit (INDEPENDENT_AMBULATORY_CARE_PROVIDER_SITE_OTHER): Payer: Medicare Other | Admitting: Family Medicine

## 2021-06-05 ENCOUNTER — Other Ambulatory Visit: Payer: Self-pay

## 2021-06-05 ENCOUNTER — Encounter: Payer: Self-pay | Admitting: Family Medicine

## 2021-06-05 VITALS — BP 112/80 | HR 91 | Temp 97.0°F | Ht 62.25 in | Wt 161.5 lb

## 2021-06-05 DIAGNOSIS — E785 Hyperlipidemia, unspecified: Secondary | ICD-10-CM

## 2021-06-05 DIAGNOSIS — F419 Anxiety disorder, unspecified: Secondary | ICD-10-CM | POA: Diagnosis not present

## 2021-06-05 LAB — COMPREHENSIVE METABOLIC PANEL
ALT: 13 U/L (ref 0–35)
AST: 18 U/L (ref 0–37)
Albumin: 4.6 g/dL (ref 3.5–5.2)
Alkaline Phosphatase: 154 U/L — ABNORMAL HIGH (ref 39–117)
BUN: 15 mg/dL (ref 6–23)
CO2: 26 mEq/L (ref 19–32)
Calcium: 10.3 mg/dL (ref 8.4–10.5)
Chloride: 104 mEq/L (ref 96–112)
Creatinine, Ser: 1 mg/dL (ref 0.40–1.20)
GFR: 56.65 mL/min — ABNORMAL LOW (ref >60.00)
Glucose, Bld: 102 mg/dL — ABNORMAL HIGH (ref 70–99)
Potassium: 4.3 mEq/L (ref 3.5–5.1)
Sodium: 139 mEq/L (ref 135–145)
Total Bilirubin: 1.6 mg/dL — ABNORMAL HIGH (ref 0.2–1.2)
Total Protein: 7.1 g/dL (ref 6.0–8.3)

## 2021-06-05 LAB — TSH: TSH: 2.19 u[IU]/mL (ref 0.35–5.50)

## 2021-06-05 LAB — LIPID PANEL
Cholesterol: 179 mg/dL (ref 0–200)
HDL: 47 mg/dL (ref >39.00)
LDL Cholesterol: 101 mg/dL — ABNORMAL HIGH (ref 0–99)
NonHDL: 131.89
Total CHOL/HDL Ratio: 4
Triglycerides: 156 mg/dL — ABNORMAL HIGH (ref 0.0–149.0)
VLDL: 31.2 mg/dL (ref 0.0–40.0)

## 2021-06-05 MED ORDER — ATORVASTATIN CALCIUM 10 MG PO TABS: 10.0000 mg | ORAL_TABLET | Freq: Every day | ORAL | 3 refills | Status: DC

## 2021-06-05 MED ORDER — PROPRANOLOL HCL 10 MG PO TABS: 10.0000 mg | ORAL_TABLET | Freq: Every day | ORAL | 0 refills | Status: DC | PRN

## 2021-06-05 NOTE — Assessment & Plan Note (Signed)
Pt notes anxiety around crowds and new activities for 3 months. Advised therapy to try and find trigger but also discussed propranolol trial. Hand out for anxiety management and numbers to therapy provided. Advised only using medication for more significant symptoms/activities and to monitor for lightheadedness/dizziness. Return if ineffective or worsening.

## 2021-06-05 NOTE — Assessment & Plan Note (Signed)
Lab Results  Component Value Date   CHOL 172 11/30/2019   HDL 45.90 11/30/2019   LDLCALC 95 11/30/2019   TRIG 154.0 (H) 11/30/2019   CHOLHDL 4 11/30/2019   Cont atorvastatin 10 mg. Recheck today

## 2021-06-05 NOTE — Patient Instructions (Signed)
Try the Propranolol 30-60 minutes before anxiety producing activity - may cause lightheadedness or dizziness   How to help anxiety - without medication.   1) Regular Exercise - walking, jogging, cycling, dancing, strength training --> Yoga has been shown in research to reduce depression and anxiety -- with even just one hour long session per week  2)  Begin a Mindfulness/Meditation practice -- this can take a little as 3 minutes and is helpful for all kinds of mood issues -- You can find resources in books -- Or you can download apps like  ---- Headspace App (which currently has free content called "Weathering the Storm") ---- Calm (which has a few free options)  ---- Insignt Timer ---- Stop, Breathe & Think  # With each of these Apps - you should decline the "start free trial" offer and as you search through the App should be able to access some of their free content. You can also chose to pay for the content if you find one that works well for you.   # Many of them also offer sleep specific content which may help with insomnia  3) Healthy Diet -- Avoid or decrease Caffeine -- Avoid or decrease Alcohol -- Drink plenty of water, have a balanced diet -- Avoid cigarettes and marijuana (as well as other recreational drugs)  4) Consider contacting a professional therapist  -- [facility] is one option. Call [phone] -- Or you can check out www.psychologytoday.com -- you can read bios of therapists and see if they accept insurance -- Check with your insurance to see if you have coverage and who may take your insurance      [place] location:   [facility] [phone]-  -Counseling/therapy (14 years and up- Medicaid insurance only)   -Psychiatry services most insurances accepted-treat and evaluate/diagnose patients and prescribe medications.  -Medication Management 71 years old to 65 years.  -Diagnoses treated: Depression/anxiety, ADHD, Substance Abuse,  Bipolar Disorder, Psychotherapy, Pain Management, Spiritual Care Services.  [facility] (19 years of age and older) [phone]- Psychology services/therapy only.  [employer] [phone]- All ages-Just Therapy services   [employer] [phone]- Employee Assistance Programs-counseling only.   [facility] [phone]- All ages-Psychology and Psychiatrist services (evaluate, treat, diagnose, prescribe medication) Treat all the diagnoses that would fall under Behavioral issues.   For new patients, on Monday, Wednesday and Friday from 8 am to 3 pm patient would just walk in and fill out paperwork and they would get patient enrolled in the services they need based on their answers.   [employer] [phone]- All ages. Monday through Friday-9am to 4 pm walk in times for patients to come in and be evaluated. Medication Management only at this time. No therapy available.   [place] location:   [facility] Address: [address] [place] [address] Phone: [phone] Counseling and psychiatry services.   [facility] and [place] locations)- [phone]- all ages, only therapy/counseling services/psychological evaluations. Services: aptitude testing, academic achievement testing, learning Disability evaluation, ADHD evaluations, psycho-educational evaluations, readiness for kindergarten Evaluations.   [place] [phone] location only has therapy services right now  [facility] [phone] services only. Works off [employer] in [employer].

## 2021-06-05 NOTE — Progress Notes (Signed)
Subjective:     Alexandria Mason is a 70 y.o. female presenting for Medication Refill and Anxiety     Medication Refill  Anxiety     #Anxiety - walking with her neighbor - staying busy with friends - enjoys baking - stays active - feeling anxiousness when going in the car or large crowd - symptoms started in June - never had an issue with this in the past - slight anxiety coming to the doctor today, but mild - no issues with going to the grocery store - triggers: funerals, vacation with   Falls not an issue - no bladder issues   Review of Systems   Social History   Tobacco Use  Smoking Status Never  Smokeless Tobacco Never        Objective:    BP Readings from Last 3 Encounters:  06/05/21 112/80  08/24/20 (!) 97/55  08/21/20 103/61   Wt Readings from Last 3 Encounters:  06/05/21 161 lb 8 oz (73.3 kg)  08/19/20 164 lb 7.4 oz (74.6 kg)  08/16/20 160 lb (72.6 kg)    BP 112/80   Pulse 91   Temp (!) 97 F (36.1 C) (Temporal)   Ht 5' 2.25" (1.581 m)   Wt 161 lb 8 oz (73.3 kg)   SpO2 98%   BMI 29.30 kg/m    Physical Exam Constitutional:      General: She is not in acute distress.    Appearance: She is well-developed. She is not diaphoretic.  HENT:     Right Ear: External ear normal.     Left Ear: External ear normal.  Eyes:     Conjunctiva/sclera: Conjunctivae normal.  Cardiovascular:     Rate and Rhythm: Normal rate.  Pulmonary:     Effort: Pulmonary effort is normal.  Musculoskeletal:     Cervical back: Neck supple.  Skin:    General: Skin is warm and dry.     Capillary Refill: Capillary refill takes less than 2 seconds.  Neurological:     Mental Status: She is alert. Mental status is at baseline.  Psychiatric:        Mood and Affect: Mood normal.        Behavior: Behavior normal.    GAD 7 : Generalized Anxiety Score 06/05/2021 10/20/2018  Nervous, Anxious, on Edge 1 1  Control/stop worrying 1 1  Worry too much - different  things 1 1  Trouble relaxing 0 2  Restless 0 0  Easily annoyed or irritable 0 0  Afraid - awful might happen 0 0  Total GAD 7 Score 3 5  Anxiety Difficulty - Not difficult at all          Assessment & Plan:   Problem List Items Addressed This Visit       Other   Anxiety - Primary    Pt notes anxiety around crowds and new activities for 3 months. Advised therapy to try and find trigger but also discussed propranolol trial. Hand out for anxiety management and numbers to therapy provided. Advised only using medication for more significant symptoms/activities and to monitor for lightheadedness/dizziness. Return if ineffective or worsening.       Relevant Medications   propranolol (INDERAL) 10 MG tablet   Other Relevant Orders   TSH   HLD (hyperlipidemia)    Lab Results  Component Value Date   CHOL 172 11/30/2019   HDL 45.90 11/30/2019   LDLCALC 95 11/30/2019   TRIG 154.0 (H) 11/30/2019  CHOLHDL 4 11/30/2019  Cont atorvastatin 10 mg. Recheck today      Relevant Medications   atorvastatin (LIPITOR) 10 MG tablet   propranolol (INDERAL) 10 MG tablet   Other Relevant Orders   Comprehensive metabolic panel   Lipid panel     Return if symptoms worsen or fail to improve.  Lynnda Child, MD  This visit occurred during the SARS-CoV-2 public health emergency.  Safety protocols were in place, including screening questions prior to the visit, additional usage of staff PPE, and extensive cleaning of exam room while observing appropriate contact time as indicated for disinfecting solutions.

## 2021-06-06 ENCOUNTER — Other Ambulatory Visit: Payer: Self-pay | Admitting: Family Medicine

## 2021-06-06 DIAGNOSIS — E785 Hyperlipidemia, unspecified: Secondary | ICD-10-CM

## 2021-06-06 MED ORDER — ATORVASTATIN CALCIUM 20 MG PO TABS: 20.0000 mg | ORAL_TABLET | Freq: Every day | ORAL | 3 refills | Status: DC

## 2021-06-18 IMAGING — DX DG CHEST 1V PORT
1 series · 1 of 1 positions shown · non-contrast
Comparison: None.

CLINICAL DATA: XL0EU-AK positive, weakness, nausea and vomiting

EXAM:
PORTABLE CHEST 1 VIEW

[chest ap]
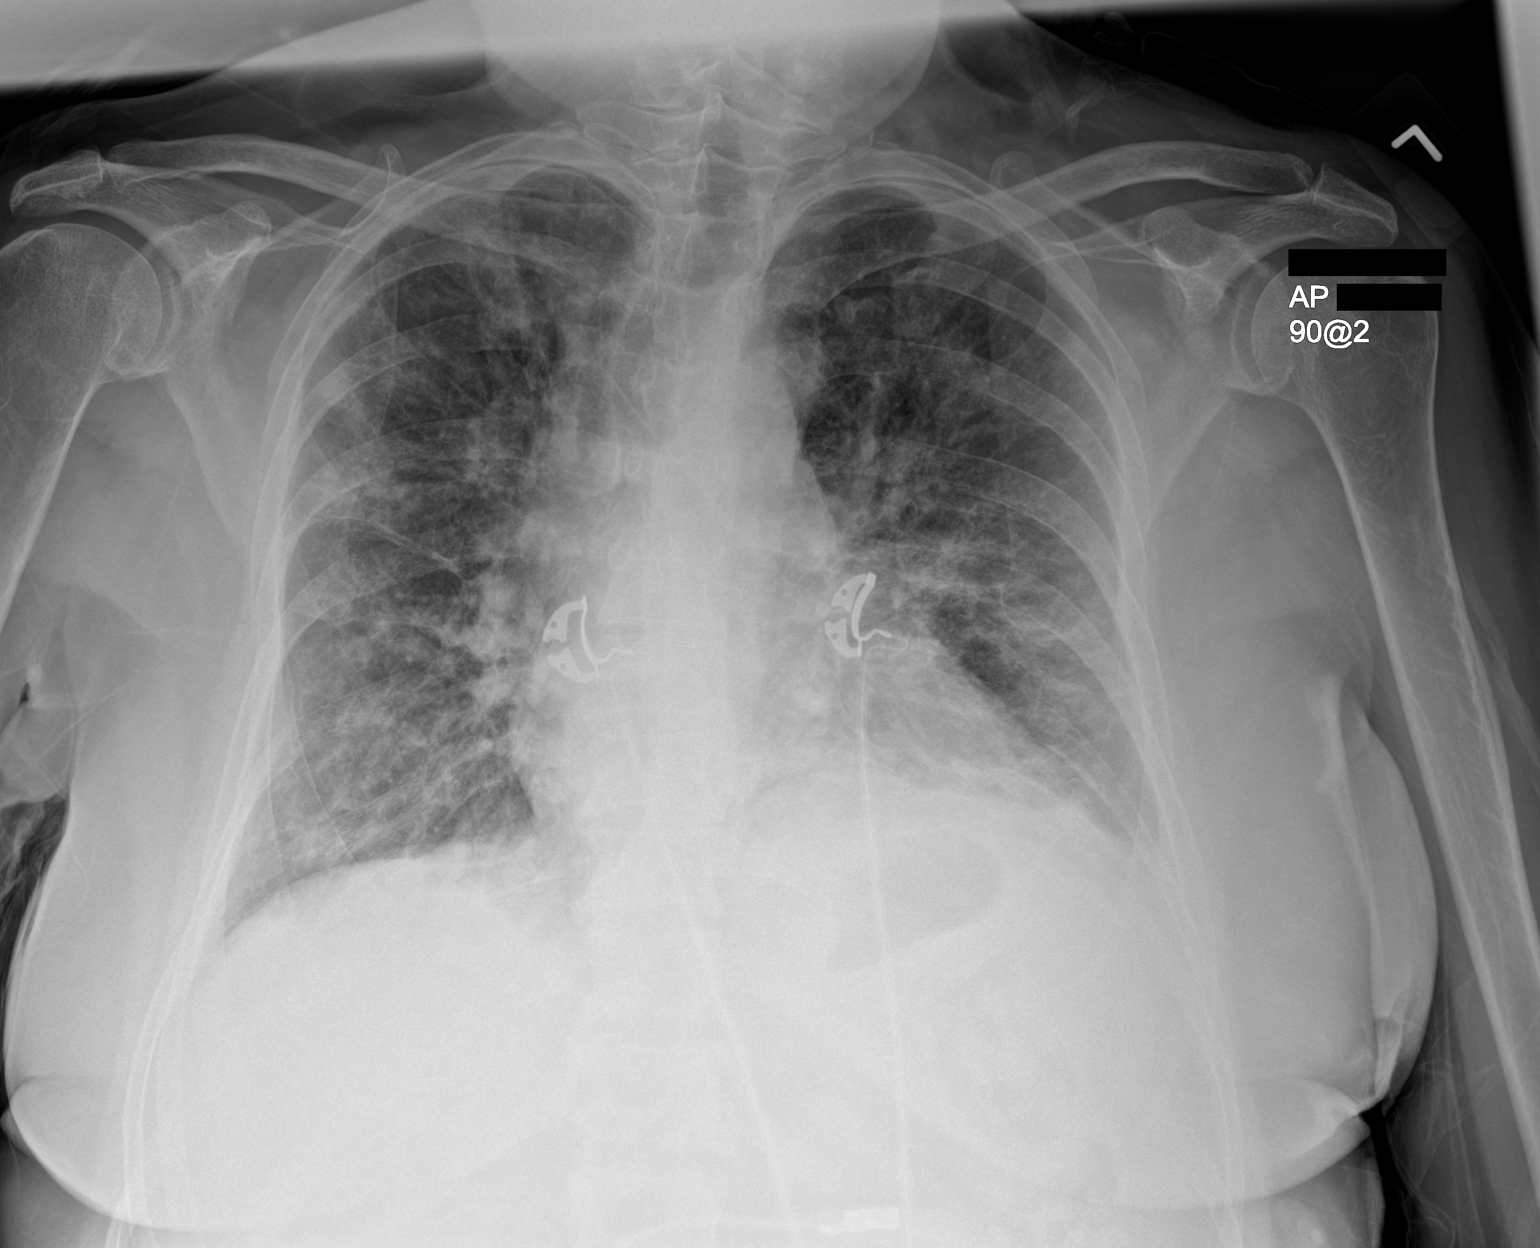

[1 of 1 positions shown; findings below may reference images not displayed]

FINDINGS: Single frontal view of the chest demonstrates an unremarkable
cardiac silhouette. Diffuse interstitial prominence with scattered
bilateral areas of airspace disease. No effusion or pneumothorax. No
acute bony abnormality.
IMPRESSION: 1. Findings consistent with multifocal XL0EU-AK pneumonia.

## 2021-06-20 IMAGING — DX DG CHEST 1V PORT
1 series · 1 of 1 positions shown · non-contrast
Comparison: 08/16/2020 chest radiograph.

CLINICAL DATA: ZUPPQ-89 positive, nausea and vomiting

EXAM:
PORTABLE CHEST 1 VIEW

[chest ap]
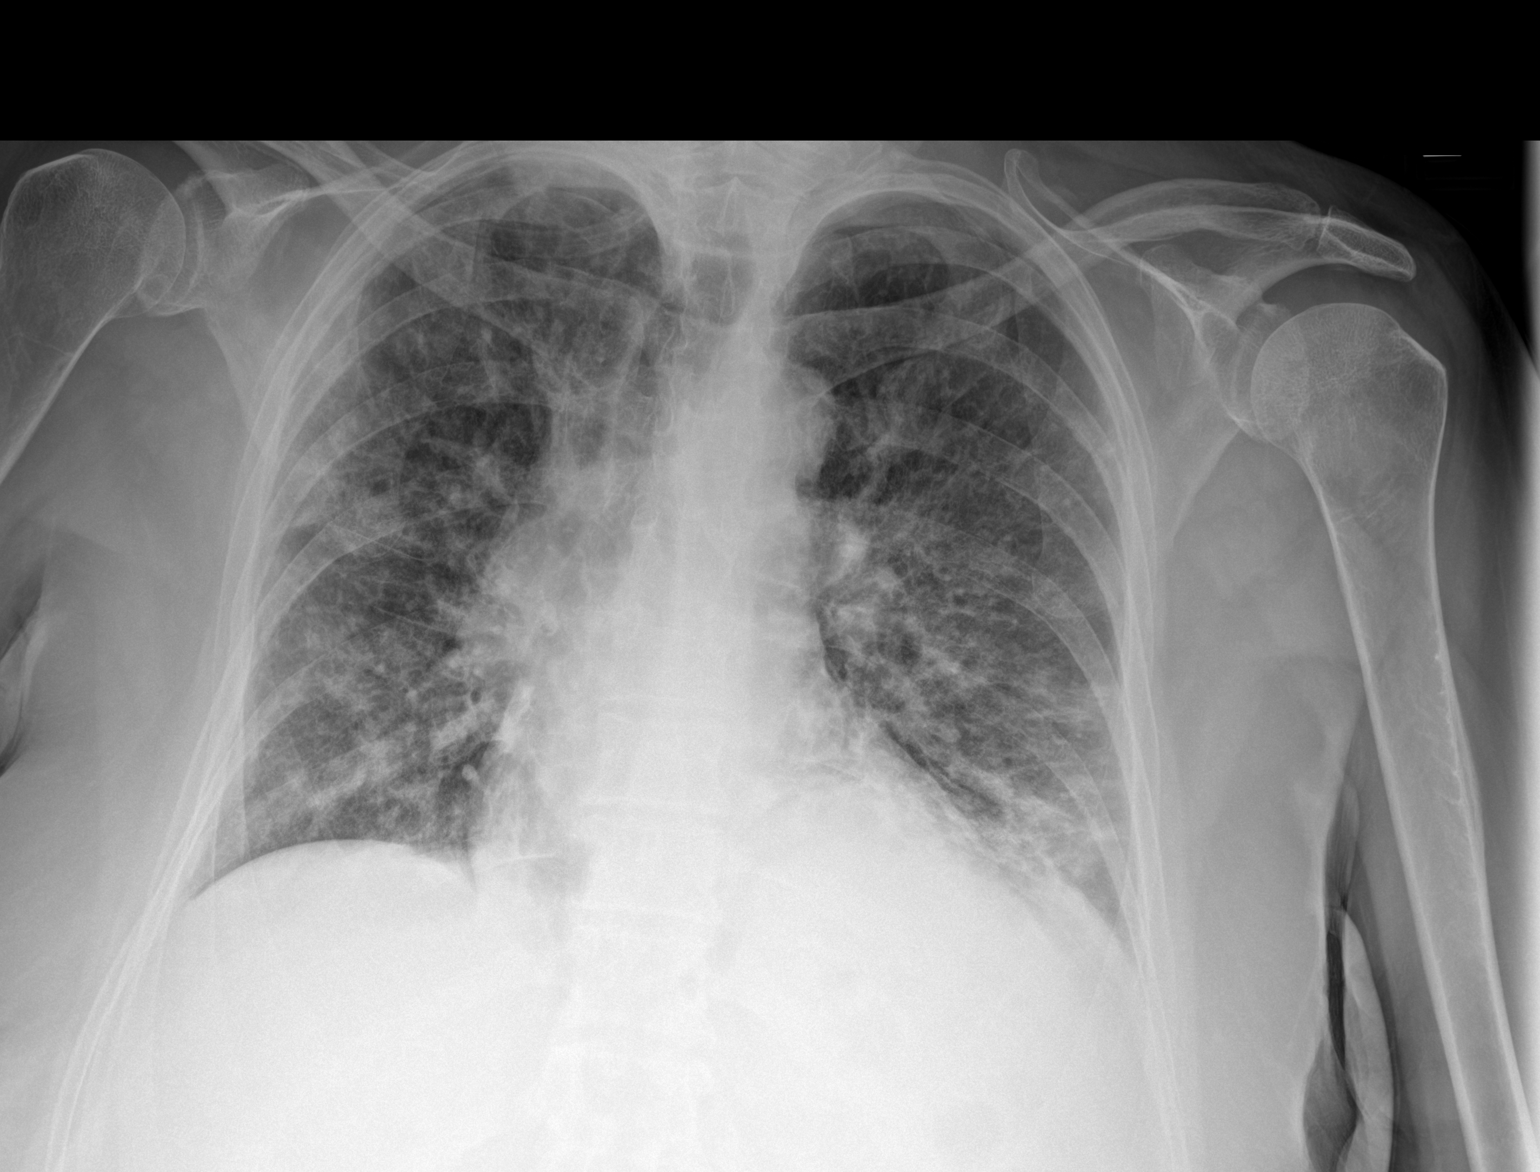

[1 of 1 positions shown; findings below may reference images not displayed]

FINDINGS: Stable cardiomediastinal silhouette with top-normal heart size. No
pneumothorax. No pleural effusion. Moderate patchy opacities
throughout both lungs, predominantly peripheral, stable to slightly
worsened.
IMPRESSION: Moderate patchy opacities throughout both lungs, stable to slightly
worsened, compatible with ZUPPQ-89 pneumonia.

## 2021-06-28 ENCOUNTER — Other Ambulatory Visit: Payer: Self-pay | Admitting: Family Medicine

## 2021-06-28 DIAGNOSIS — F419 Anxiety disorder, unspecified: Secondary | ICD-10-CM

## 2021-06-29 NOTE — Telephone Encounter (Signed)
Last filled on 06/05/21 #30 with 0 refills

## 2021-07-02 NOTE — Telephone Encounter (Signed)
Refill provided.   We discussed prn use and if she is out, it might be a good idea to think about therapy or something more long acting  Please offer f/u visit or number for therapy if patient prefers

## 2021-07-25 ENCOUNTER — Other Ambulatory Visit: Payer: Self-pay | Admitting: Family Medicine

## 2021-07-25 DIAGNOSIS — F419 Anxiety disorder, unspecified: Secondary | ICD-10-CM

## 2021-07-25 NOTE — Telephone Encounter (Signed)
Message was sent to support pool to get pt scheduled.

## 2021-07-25 NOTE — Telephone Encounter (Signed)
LMTCB to schedule an appt.

## 2021-09-05 ENCOUNTER — Other Ambulatory Visit: Payer: Self-pay | Admitting: Family Medicine

## 2021-09-05 DIAGNOSIS — F419 Anxiety disorder, unspecified: Secondary | ICD-10-CM

## 2021-09-10 ENCOUNTER — Other Ambulatory Visit: Payer: Self-pay | Admitting: Family Medicine

## 2021-09-10 DIAGNOSIS — F419 Anxiety disorder, unspecified: Secondary | ICD-10-CM

## 2021-09-11 ENCOUNTER — Telehealth: Payer: Self-pay | Admitting: Family Medicine

## 2021-09-11 NOTE — Telephone Encounter (Signed)
°  Encourage patient to contact the pharmacy for refills or they can request refills through [facility]  LAST APPOINTMENT DATE:  Please schedule appointment if longer than 1 year  NEXT APPOINTMENT DATE:  MEDICATION:propranolol (INDERAL) 10 MG tablet   Is the patient out of medication?   PHARMACY:[id number] [place], [place] - [address]    Let patient know to contact pharmacy at the end of the day to make sure medication is ready.  Please notify patient to allow 48-72 hours to process  CLINICAL FILLS OUT ALL BELOW:   LAST REFILL:  QTY:  REFILL DATE:    OTHER COMMENTS:    Okay for refill?  Please advise

## 2021-09-11 NOTE — Telephone Encounter (Signed)
LMTCB to scheduled a med refill

## 2021-09-12 NOTE — Telephone Encounter (Signed)
2nd Attempt  LMTCB to schedule appt

## 2021-09-14 NOTE — Telephone Encounter (Signed)
error 

## 2021-09-17 ENCOUNTER — Other Ambulatory Visit: Payer: Self-pay | Admitting: Family Medicine

## 2021-09-17 DIAGNOSIS — F419 Anxiety disorder, unspecified: Secondary | ICD-10-CM

## 2021-09-18 ENCOUNTER — Other Ambulatory Visit: Payer: Self-pay | Admitting: Family

## 2021-09-18 DIAGNOSIS — F419 Anxiety disorder, unspecified: Secondary | ICD-10-CM

## 2021-09-18 MED ORDER — PROPRANOLOL HCL 10 MG PO TABS: 10.0000 mg | ORAL_TABLET | Freq: Every day | ORAL | 0 refills | Status: DC | PRN

## 2021-09-18 NOTE — Telephone Encounter (Signed)
Patient could not complete appointment until 2.20.22 when she has someone to take her. Appt is scheduled with Mort Sawyers; patient requesting 30 day supply until then  Please advise

## 2021-09-19 NOTE — Telephone Encounter (Signed)
Patient called and is aware of her Rx at the pharmacy. Patient stated her appreciation.

## 2021-09-21 ENCOUNTER — Other Ambulatory Visit: Payer: Self-pay | Admitting: Family

## 2021-09-21 DIAGNOSIS — F419 Anxiety disorder, unspecified: Secondary | ICD-10-CM

## 2021-10-11 NOTE — Telephone Encounter (Signed)
°  Encourage patient to contact the pharmacy for refills or they can request refills through [facility] ° °LAST APPOINTMENT DATE:  Please schedule appointment if longer than 1 year ° °NEXT APPOINTMENT DATE: ° °MEDICATION:propranolol (INDERAL) 10 MG tablet  ° °Is the patient out of medication?  ° °PHARMACY:[id number] - [place], [place] - [address]   ° °Let patient know to contact pharmacy at the end of the day to make sure medication is ready. ° °Please notify patient to allow 48-72 hours to process ° °CLINICAL FILLS OUT ALL BELOW:  ° °LAST REFILL: ° °QTY: ° °REFILL DATE: ° ° ° °OTHER COMMENTS:  ° ° °Okay for refill? ° °Please advise ° ° ° ° °

## 2021-10-16 NOTE — Telephone Encounter (Signed)
Can not fill. Pt needs an appt. first

## 2021-11-05 ENCOUNTER — Encounter: Payer: Self-pay | Admitting: Family

## 2021-11-05 ENCOUNTER — Other Ambulatory Visit: Payer: Self-pay | Admitting: Family

## 2021-11-05 ENCOUNTER — Other Ambulatory Visit: Payer: Self-pay

## 2021-11-05 ENCOUNTER — Ambulatory Visit (INDEPENDENT_AMBULATORY_CARE_PROVIDER_SITE_OTHER): Payer: Medicare Other | Admitting: Family

## 2021-11-05 VITALS — BP 118/64 | HR 86 | Ht 62.0 in | Wt 165.0 lb

## 2021-11-05 DIAGNOSIS — R7989 Other specified abnormal findings of blood chemistry: Secondary | ICD-10-CM | POA: Insufficient documentation

## 2021-11-05 DIAGNOSIS — E785 Hyperlipidemia, unspecified: Secondary | ICD-10-CM

## 2021-11-05 DIAGNOSIS — F419 Anxiety disorder, unspecified: Secondary | ICD-10-CM | POA: Diagnosis not present

## 2021-11-05 DIAGNOSIS — Z79899 Other long term (current) drug therapy: Secondary | ICD-10-CM | POA: Diagnosis not present

## 2021-11-05 DIAGNOSIS — E782 Mixed hyperlipidemia: Secondary | ICD-10-CM

## 2021-11-05 DIAGNOSIS — D75839 Thrombocytosis, unspecified: Secondary | ICD-10-CM | POA: Diagnosis not present

## 2021-11-05 LAB — COMPREHENSIVE METABOLIC PANEL
ALT: 17 U/L (ref 0–35)
AST: 21 U/L (ref 0–37)
Albumin: 4.4 g/dL (ref 3.5–5.2)
Alkaline Phosphatase: 191 U/L — ABNORMAL HIGH (ref 39–117)
BUN: 12 mg/dL (ref 6–23)
CO2: 32 mEq/L (ref 19–32)
Calcium: 9.7 mg/dL (ref 8.4–10.5)
Chloride: 102 mEq/L (ref 96–112)
Creatinine, Ser: 1.05 mg/dL (ref 0.40–1.20)
GFR: 53.27 mL/min — ABNORMAL LOW (ref >60.00)
Glucose, Bld: 97 mg/dL (ref 70–99)
Potassium: 4 mEq/L (ref 3.5–5.1)
Sodium: 139 mEq/L (ref 135–145)
Total Bilirubin: 1.5 mg/dL — ABNORMAL HIGH (ref 0.2–1.2)
Total Protein: 7 g/dL (ref 6.0–8.3)

## 2021-11-05 LAB — CBC WITH DIFFERENTIAL/PLATELET
Basophils Absolute: 0 K/uL (ref 0.0–0.1)
Basophils Relative: 0.5 % (ref 0.0–3.0)
Eosinophils Absolute: 0.1 K/uL (ref 0.0–0.7)
Eosinophils Relative: 2.1 % (ref 0.0–5.0)
HCT: 43.8 % (ref 36.0–46.0)
Hemoglobin: 14.3 g/dL (ref 12.0–15.0)
Lymphocytes Relative: 27 % (ref 12.0–46.0)
Lymphs Abs: 1.6 K/uL (ref 0.7–4.0)
MCHC: 32.5 g/dL (ref 30.0–36.0)
MCV: 91.5 fl (ref 78.0–100.0)
Monocytes Absolute: 0.5 K/uL (ref 0.1–1.0)
Monocytes Relative: 8.6 % (ref 3.0–12.0)
Neutro Abs: 3.6 K/uL (ref 1.4–7.7)
Neutrophils Relative %: 61.8 % (ref 43.0–77.0)
Platelets: 204 K/uL (ref 150.0–400.0)
RBC: 4.79 Mil/uL (ref 3.87–5.11)
RDW: 12.9 % (ref 11.5–15.5)
WBC: 5.8 K/uL (ref 4.0–10.5)

## 2021-11-05 LAB — FERRITIN: Ferritin: 72.6 ng/mL (ref 10.0–291.0)

## 2021-11-05 LAB — LIPID PANEL
Cholesterol: 159 mg/dL (ref 0–200)
HDL: 53.9 mg/dL
LDL Cholesterol: 72 mg/dL (ref 0–99)
NonHDL: 105.11
Total CHOL/HDL Ratio: 3
Triglycerides: 167 mg/dL — ABNORMAL HIGH (ref 0.0–149.0)
VLDL: 33.4 mg/dL (ref 0.0–40.0)

## 2021-11-05 LAB — CK: Total CK: 115 U/L (ref 7–177)

## 2021-11-05 MED ORDER — PROPRANOLOL HCL 10 MG PO TABS: 10.0000 mg | ORAL_TABLET | Freq: Every day | ORAL | 2 refills | Status: DC | PRN

## 2021-11-05 MED ORDER — PROPRANOLOL HCL 10 MG PO TABS: 10.0000 mg | ORAL_TABLET | Freq: Every day | ORAL | 0 refills | Status: DC | PRN

## 2021-11-05 MED ORDER — ATORVASTATIN CALCIUM 20 MG PO TABS: 20.0000 mg | ORAL_TABLET | Freq: Every day | ORAL | 0 refills | Status: DC

## 2021-11-05 NOTE — Assessment & Plan Note (Signed)
Elevated platelets in the past patient does complain of easily bruisability.  She has never seen hematologist for work-up repeat CBC today pending these results if still elevated may consider hematology referral

## 2021-11-05 NOTE — Assessment & Plan Note (Signed)
Reviewed GAD-7 doing well on propanolol as needed refill sent to pharmacy and handout given for anxiety reducing techniques

## 2021-11-05 NOTE — Assessment & Plan Note (Signed)
Repeat CMP today pending results no abdominal pain at present could likely be due to her excessive intake of tea throughout the day have discussed this with her but we will see if the elevated liver function has completely resolved.  To consider Lipitor if still elevated

## 2021-11-05 NOTE — Progress Notes (Signed)
Please let patient know the elevated liver function have increased slightly the alkaline phosphatase is now 191 it was 154 and the total bilirubin is 1.5 stable as it was 1.6 prior.  At this point I am going to add an additional test to make sure there is nothing else and I am also going to order an ultrasound of the abdomen.  Please advise patient to call and make appointment to [facility] outpatient diagnostic center I have side sent the order for the ultrasound there.  This will visualize the liver and tell us if there are any concerns.  This may be the Tea however I think it is less likely.  Another consideration is the atorvastatin but lets rule out these other things first.

## 2021-11-05 NOTE — Progress Notes (Signed)
Established Patient Office Visit  Subjective:  Patient ID: Alexandria Mason, female    DOB: [birth year]  Age: 71 y.o. MRN: [id number]  CC:  Chief Complaint  Patient presents with   Medication Refill    HPI Alexandria Mason is here today for follow up  Pt is without acute concerns.  Anxiety: propanolol 10 mg , taking about twice a week on average for anxiety, otherwise leveled out and feeling good/ helps with her anxiety. Does have a few daily stressors, needs a new roof at her house and also has some car concerns, but other than that doing well.   GAD 7 : Generalized Anxiety Score 11/05/2021 06/05/2021 10/20/2018  Nervous, Anxious, on Edge 1 1 1   Control/stop worrying 2 1 1   Worry too much - different things 2 1 1   Trouble relaxing 1 0 2  Restless 0 0 0  Easily annoyed or irritable 0 0 0  Afraid - awful might happen 0 0 0  Total GAD 7 Score 6 3 5   Anxiety Difficulty - - Not difficult at all    Thrombocytosis: does find she bruises easily.   Elevated liver function tests: takes naproxen occasional. Doesn't drink alcohol. No abdominal pain. Tea 4-5 times daily. Also taking atorvastatin.    Past Medical History:  Diagnosis Date   Acute hypoxemic respiratory failure due to COVID-19 [facility]) 08/18/2020   Gastroenteritis due to COVID-19 virus 08/18/2020    Past Surgical History:  Procedure Laterality Date   BUNIONECTOMY     x 2-bilateral feet- different years for each one   CATARACT EXTRACTION Left 2011   oral surgeries     WRIST FRACTURE SURGERY Right 08/2006   metal plate present    Family History  Problem Relation Age of Onset   Congestive Heart Failure Mother    Heart disease Father    Heart attack Father 5   Heart disease Brother    Heart attack Paternal Grandfather     Social History   Socioeconomic History   Marital status: Divorced    Spouse name: Not on file   Number of children: 1   Years of education: some college   Highest education level: Not on file   Occupational History   Not on file  Tobacco Use   Smoking status: Never   Smokeless tobacco: Never  Vaping Use   Vaping Use: Never used  Substance and Sexual Activity   Alcohol use: Not Currently    Comment: rarely   Drug use: Never   Sexual activity: Not Currently  Other Topics Concern   Not on file  Social History Narrative   Living on social security, retired from Engineering geologist   Lives alone   One daughter - Alexandria Mason    Enjoys: going to the gym, walking with her neighbor   Exercise: gym 3 times a week   Social support - yes, good friends, daughter   Diet: pretty healthy - salads, meats, chicken   Social Determinants of Health   Financial Resource Strain: Not on file  Food Insecurity: Not on file  Transportation Needs: Not on file  Physical Activity: Not on file  Stress: Not on file  Social Connections: Not on file  Intimate Partner Violence: Not on file    Outpatient Medications Prior to Visit  Medication Sig Dispense Refill   Naproxen Sodium 220 MG CAPS Take by mouth daily as needed.     atorvastatin (LIPITOR) 20 MG tablet Take 1 tablet (20 mg total) by mouth  daily. 90 tablet 3   propranolol (INDERAL) 10 MG tablet Take 1 tablet (10 mg total) by mouth daily as needed (take 1 hour before anxious event). 10 tablet 0   No facility-administered medications prior to visit.    Allergies  Allergen Reactions   Oxycodone-Acetaminophen     Hallucinations   Sulfa Antibiotics     Upset stomach    ROS Review of Systems  Review of Systems  Respiratory:  Negative for shortness of breath.   Cardiovascular:  Negative for chest pain and palpitations.  Gastrointestinal:  Negative for constipation and diarrhea.  Genitourinary:  Negative for dysuria, frequency and urgency.  Musculoskeletal:  Negative for myalgias.  Psychiatric/Behavioral:  Negative for depression and suicidal ideas.  Anxiety at times All other systems reviewed and are negative.    Objective:    Physical  Exam Vitals reviewed.  Constitutional:      General: She is not in acute distress.    Appearance: Normal appearance. She is obese. She is not ill-appearing, toxic-appearing or diaphoretic.  HENT:     Head: Normocephalic.  Cardiovascular:     Rate and Rhythm: Normal rate and regular rhythm.  Pulmonary:     Effort: Pulmonary effort is normal.  Abdominal:     General: Abdomen is flat.     Palpations: Abdomen is soft.     Tenderness: There is no abdominal tenderness. There is no rebound.     Hernia: No hernia is present.  Neurological:     Mental Status: She is alert.      BP 118/64    Pulse 86    Ht 5\' 2"  (1.575 m)    Wt 165 lb (74.8 kg)    SpO2 95%    BMI 30.18 kg/m  Wt Readings from Last 3 Encounters:  11/05/21 165 lb (74.8 kg)  06/05/21 161 lb 8 oz (73.3 kg)  08/19/20 164 lb 7.4 oz (74.6 kg)     Health Maintenance Due  Topic Date Due   COVID-19 Vaccine (1) Never done   MAMMOGRAM  Never done   Zoster Vaccines- Shingrix (1 of 2) Never done   DEXA SCAN  Never done    There are no preventive care reminders to display for this patient.  Lab Results  Component Value Date   TSH 2.19 06/05/2021   Lab Results  Component Value Date   WBC 8.7 08/21/2020   HGB 13.6 08/21/2020   HCT 39.6 08/21/2020   MCV 88.0 08/21/2020   PLT 482 (H) 08/21/2020   Lab Results  Component Value Date   NA 139 06/05/2021   K 4.3 06/05/2021   CO2 26 06/05/2021   GLUCOSE 102 (H) 06/05/2021   BUN 15 06/05/2021   CREATININE 1.00 06/05/2021   BILITOT 1.6 (H) 06/05/2021   ALKPHOS 154 (H) 06/05/2021   AST 18 06/05/2021   ALT 13 06/05/2021   PROT 7.1 06/05/2021   ALBUMIN 4.6 06/05/2021   CALCIUM 10.3 06/05/2021   ANIONGAP 7 08/23/2020   GFR 56.65 (L) 06/05/2021   Lab Results  Component Value Date   CHOL 179 06/05/2021   Lab Results  Component Value Date   HDL 47.00 06/05/2021   Lab Results  Component Value Date   LDLCALC 101 (H) 06/05/2021   Lab Results  Component Value Date    TRIG 156.0 (H) 06/05/2021   Lab Results  Component Value Date   CHOLHDL 4 06/05/2021   Lab Results  Component Value Date   HGBA1C 5.6 10/20/2018  Assessment & Plan:   Problem List Items Addressed This Visit       Hematopoietic and Hemostatic   Thrombocytosis    Elevated platelets in the past patient does complain of easily bruisability.  She has never seen hematologist for work-up repeat CBC today pending these results if still elevated may consider hematology referral        Other   Anxiety    Reviewed GAD-7 doing well on propanolol as needed refill sent to pharmacy and handout given for anxiety reducing techniques      Relevant Medications   propranolol (INDERAL) 10 MG tablet   HLD (hyperlipidemia)    Cholesterol lipid panel ordered and pending results continue atorvastatin as directed refill sent to pharmacy patient to work on a low-cholesterol diet and exercise as tolerated      Relevant Medications   atorvastatin (LIPITOR) 20 MG tablet   propranolol (INDERAL) 10 MG tablet   Other Relevant Orders   Lipid Profile   Elevated liver function tests - Primary    Repeat CMP today pending results no abdominal pain at present could likely be due to her excessive intake of tea throughout the day have discussed this with her but we will see if the elevated liver function has completely resolved.  To consider Lipitor if still elevated      Relevant Orders   Comprehensive metabolic panel   Other Visit Diagnoses     Elevated platelet count       Relevant Orders   CBC w/Diff   Ferritin       Meds ordered this encounter  Medications   DISCONTD: propranolol (INDERAL) 10 MG tablet    Sig: Take 1 tablet (10 mg total) by mouth daily as needed (take 1 hour before anxious event).    Dispense:  10 tablet    Refill:  0    Needs appt prior to next refill   atorvastatin (LIPITOR) 20 MG tablet    Sig: Take 1 tablet (20 mg total) by mouth daily.    Dispense:  90 tablet     Refill:  0    Order Specific Question:   Supervising Provider    Answer:   BEDSOLE, AMY E [[id number]]   propranolol (INDERAL) 10 MG tablet    Sig: Take 1 tablet (10 mg total) by mouth daily as needed (take 1 hour before anxious event).    Dispense:  30 tablet    Refill:  2    Needs appt prior to next refill    Order Specific Question:   Supervising Provider    Answer:   Kerby Nora E [[id number]]    Follow-up: Return in about 3 months (around 02/02/2022) for with Dr. [name] her PCP.    Mort Sawyers, FNP

## 2021-11-05 NOTE — Assessment & Plan Note (Signed)
Cholesterol lipid panel ordered and pending results continue atorvastatin as directed refill sent to pharmacy patient to work on a low-cholesterol diet and exercise as tolerated

## 2021-11-05 NOTE — Patient Instructions (Signed)
Stop by the lab prior to leaving today. I will notify you of your results once received.  ? ?It was a pleasure seeing you today! Please do not hesitate to reach out with any questions and or concerns. ? ?Regards,  ? ?Creed Kail ?FNP-C ? ? ?

## 2021-11-13 ENCOUNTER — Ambulatory Visit
Admission: RE | Admit: 2021-11-13 | Discharge: 2021-11-13 | Disposition: A | Discharge status: Home / Self Care | Payer: Medicare Other | Source: Ambulatory Visit | Attending: Family | Admitting: Family

## 2021-11-13 DIAGNOSIS — R7989 Other specified abnormal findings of blood chemistry: Secondary | ICD-10-CM | POA: Insufficient documentation

## 2021-11-15 ENCOUNTER — Telehealth: Payer: Self-pay | Admitting: Family Medicine

## 2021-11-15 NOTE — Progress Notes (Signed)
Abdominal ultrasound came back unconcerning. Liver looks good.

## 2021-11-15 NOTE — Telephone Encounter (Signed)
Pt called asking for a call back to discuss her visit that she had on 11/13/21 at [facility]. Please advise. ?

## 2021-11-16 NOTE — Telephone Encounter (Signed)
Already notified pt with the results. ?

## 2021-12-12 ENCOUNTER — Ambulatory Visit (INDEPENDENT_AMBULATORY_CARE_PROVIDER_SITE_OTHER): Payer: Medicare Other | Admitting: *Deleted

## 2021-12-12 DIAGNOSIS — Z780 Asymptomatic menopausal state: Secondary | ICD-10-CM | POA: Diagnosis not present

## 2021-12-12 DIAGNOSIS — Z0000 Encounter for general adult medical examination without abnormal findings: Secondary | ICD-10-CM

## 2021-12-12 NOTE — Progress Notes (Addendum)
? ?Subjective:  ? Alexandria Mason is a 71 y.o. female who presents for Medicare Annual (Subsequent) preventive examination. ? ?I connected with  [name] on 12/25/21 by a telephone  enabled telemedicine application and verified that I am speaking with the correct person using two identifiers. ?  ?I discussed the limitations of evaluation and management by telemedicine. The patient expressed understanding and agreed to proceed. ? ?Patient location: home ? ?Provider location: Tele-Health not in office ? ? ? ?Review of Systems    ? ?Cardiac Risk Factors include: advanced age (>4men, >39 women);family history of premature cardiovascular disease ? ?   ?Objective:  ?  ?Today's Vitals  ? ?There is no height or weight on file to calculate BMI. ? ? ?  12/12/2021  ?  9:34 AM 08/18/2020  ?  4:52 AM 08/16/2020  ? 10:22 PM 11/26/2019  ?  2:49 PM  ?Advanced Directives  ?Does Patient Have a Medical Advance Directive? No No No No  ?Would patient like information on creating a medical advance directive? No - Patient declined No - Patient declined  No - Patient declined  ? ? ?Current Medications (verified) ?Outpatient Encounter Medications as of 12/12/2021  ?Medication Sig  ? atorvastatin (LIPITOR) 20 MG tablet Take 1 tablet (20 mg total) by mouth daily.  ? Naproxen Sodium 220 MG CAPS Take by mouth daily as needed.  ? propranolol (INDERAL) 10 MG tablet Take 1 tablet (10 mg total) by mouth daily as needed (take 1 hour before anxious event).  ? ?No facility-administered encounter medications on file as of 12/12/2021.  ? ? ?Allergies (verified) ?Oxycodone-acetaminophen and Sulfa antibiotics  ? ?History: ?Past Medical History:  ?Diagnosis Date  ? Acute hypoxemic respiratory failure due to COVID-19 [facility]) 08/18/2020  ? Gastroenteritis due to COVID-19 virus 08/18/2020  ? ?Past Surgical History:  ?Procedure Laterality Date  ? BUNIONECTOMY    ? x 2-bilateral feet- different years for each one  ? CATARACT EXTRACTION Left 2011  ? oral  surgeries    ? WRIST FRACTURE SURGERY Right 08/2006  ? metal plate present  ? ?Family History  ?Problem Relation Age of Onset  ? Congestive Heart Failure Mother   ? Heart disease Father   ? Heart attack Father 43  ? Heart disease Brother   ? Heart attack Paternal Grandfather   ? ?Social History  ? ?Socioeconomic History  ? Marital status: Divorced  ?  Spouse name: Not on file  ? Number of children: 1  ? Years of education: some college  ? Highest education level: Not on file  ?Occupational History  ? Not on file  ?Tobacco Use  ? Smoking status: Never  ? Smokeless tobacco: Never  ?Vaping Use  ? Vaping Use: Never used  ?Substance and Sexual Activity  ? Alcohol use: Not Currently  ?  Comment: rarely  ? Drug use: Never  ? Sexual activity: Not Currently  ?Other Topics Concern  ? Not on file  ?Social History Narrative  ? Living on social security, retired from retail  ? Lives alone  ? One daughter - Alexandria Mason   ? Enjoys: going to the gym, walking with her neighbor  ? Exercise: gym 3 times a week  ? Social support - yes, good friends, daughter  ? Diet: pretty healthy - salads, meats, chicken  ? ?Social Determinants of Health  ? ?Financial Resource Strain: Low Risk   ? Difficulty of Paying Living Expenses: Not very hard  ?Food Insecurity: No Food Insecurity  ?  Worried About Programme researcher, broadcasting/film/videounning Out of Food in the Last Year: Never true  ? Ran Out of Food in the Last Year: Never true  ?Transportation Needs: No Transportation Needs  ? Lack of Transportation (Medical): No  ? Lack of Transportation (Non-Medical): No  ?Physical Activity: Insufficiently Active  ? Days of Exercise per Week: 3 days  ? Minutes of Exercise per Session: 40 min  ?Stress: No Stress Concern Present  ? Feeling of Stress : Only a little  ?Social Connections: Moderately Integrated  ? Frequency of Communication with Friends and Family: More than three times a week  ? Frequency of Social Gatherings with Friends and Family: Twice a week  ? Attends Religious Services: More  than 4 times per year  ? Active Member of Clubs or Organizations: Yes  ? Attends BankerClub or Organization Meetings: More than 4 times per year  ? Marital Status: Divorced  ? ? ?Tobacco Counseling ?Counseling given: Not Answered ? ? ?Clinical Intake: ? ?Pre-visit preparation completed: Yes ? ?Pain : No/denies pain ? ?  ? ?Nutritional Risks: None ?Diabetes: No ? ?How often do you need to have someone help you when you read instructions, pamphlets, or other written materials from your doctor or pharmacy?: 1 - Never ? ?Diabetic?  no ? ?Interpreter Needed?: No ? ?Information entered by :: Remi HaggardJulie Jazlyne Gauger LPN ? ? ?Activities of Daily Living ? ?  12/12/2021  ?  9:40 AM  ?In your present state of health, do you have any difficulty performing the following activities:  ?Hearing? 0  ?Vision? 0  ?Difficulty concentrating or making decisions? 0  ?Walking or climbing stairs? 0  ?Dressing or bathing? 0  ?Doing errands, shopping? 0  ?Preparing Food and eating ? N  ?Using the Toilet? N  ?In the past six months, have you accidently leaked urine? N  ?Do you have problems with loss of bowel control? N  ?Managing your Medications? N  ?Managing your Finances? N  ?Housekeeping or managing your Housekeeping? N  ? ? ?Patient Care Team: ?Lynnda Childody, Jessica R, MD as PCP - General (Family Medicine) ? ?Indicate any recent Medical Services you may have received from other than [facility] providers in the past year (date may be approximate). ? ?   ?Assessment:  ? This is a routine wellness examination for Alexandria Mason. ? ?Hearing/Vision screen ?Hearing Screening - Comments:: No trouble hearing ?Vision Screening - Comments:: Up to date ?Will call a new office ? ?Dietary issues and exercise activities discussed: ?Current Exercise Habits: Home exercise routine, Type of exercise: walking, Time (Minutes): 40, Frequency (Times/Week): 3, Weekly Exercise (Minutes/Week): 120 ? ? Goals Addressed   ? ?  ?  ?  ?  ? This Visit's Progress  ?  Weight (lb) < 200 lb (90.7 kg)     ?   Keep active  ?  ? ?  ? ?Depression Screen ? ?  12/12/2021  ?  9:40 AM 11/26/2019  ?  2:52 PM 10/20/2018  ? 12:33 PM  ?PHQ 2/9 Scores  ?PHQ - 2 Score 0 0 0  ?PHQ- 9 Score  0 0  ?  ?Fall Risk ? ?  12/12/2021  ?  9:38 AM 12/12/2021  ?  9:34 AM 11/26/2019  ?  2:50 PM 10/20/2018  ? 12:34 PM  ?Fall Risk   ?Falls in the past year? 0 0 1 0  ?Comment   tri[facility] [employer] [facility] [employer]   ?Number falls in past yr: 0 0 0 0  ?Injury  with Fall? 0 0 0 0  ?Risk for fall due to :   No Fall Risks   ?Follow up Education provided;Falls prevention discussed;Falls evaluation completed Falls evaluation completed;Education provided;Falls prevention discussed Falls evaluation completed;Falls prevention discussed   ? ? ?FALL RISK PREVENTION PERTAINING TO THE HOME: ? ?Any stairs in or around the home? No  ?If so, are there any without handrails? No  ?Home free of loose throw rugs in walkways, pet beds, electrical cords, etc? Yes  ?Adequate lighting in your home to reduce risk of falls? Yes  ? ?ASSISTIVE DEVICES UTILIZED TO PREVENT FALLS: ? ?Life alert? No  ?Use of a cane, walker or w/c? No  ?Grab bars in the bathroom? No  ?Shower chair or bench in shower? No  ?Elevated toilet seat or a handicapped toilet? Yes  ? ?TIMED UP AND GO: ? ?Was the test performed? No .  ? ? ?Cognitive Function: ?Normal ? ? ?  11/26/2019  ?  2:54 PM  ?MMSE - Mini Mental State Exam  ?Orientation to time 5  ?Orientation to Place 5  ?Registration 3  ?Attention/ Calculation 5  ?Recall 3  ?Language- repeat 1  ? ?  ? ?  12/25/2021  ? 12:02 PM  ?6CIT Screen  ?What Year? 0 points  ?What month? 0 points  ?What time? 0 points  ?Count back from 20 0 points  ?Months in reverse 0 points  ?Repeat phrase 0 points  ?Total Score 0 points  ? ? ?Immunizations ?Immunization History  ?Administered Date(s) Administered  ? Pneumococcal Conjugate-13 10/20/2018  ? Pneumococcal Polysaccharide-23 11/30/2019  ? Tdap 10/23/2018  ? ? ?TDAP status: Up to date ? ?Flu Vaccine status: Up to  date ? ?Pneumococcal vaccine status: Up to date ? ?Covid-19 vaccine status: Information provided on how to obtain vaccines.  ? ?Qualifies for Shingles Vaccine? Yes   ?Zostavax completed No   ?Shingrix Completed?: No.    Kathaleen Bury

## 2021-12-12 NOTE — Patient Instructions (Signed)
Alexandria Mason , ?Thank you for taking time to come for your Medicare Wellness Visit. I appreciate your ongoing commitment to your health goals. Please review the following plan we discussed and let me know if I can assist you in the future.  ? ?Screening recommendations/referrals: ?Colonoscopy: Education provided ?Mammogram: Education provided ?Bone Density: Education provided ?Recommended yearly ophthalmology/optometry visit for glaucoma screening and checkup ?Recommended yearly dental visit for hygiene and checkup ? ?Vaccinations: ?Influenza vaccine: up to date ?Pneumococcal vaccine: up to date ?Tdap vaccine: up to date ?Shingles vaccine: Education provided   ? ?Advanced directives: Education provided ? ?Conditions/risks identified:  ? ?Next appointment: 01-21-2022 @ 10:20  Alexandria Mason ? ? ?Preventive Care 72 Years and Older, Female ?Preventive care refers to lifestyle choices and visits with your health care provider that can promote health and wellness. ?What does preventive care include? ?A yearly physical exam. This is also called an annual well check. ?Dental exams once or twice a year. ?Routine eye exams. Ask your health care provider how often you should have your eyes checked. ?Personal lifestyle choices, including: ?Daily care of your teeth and gums. ?Regular physical activity. ?Eating a healthy diet. ?Avoiding tobacco and drug use. ?Limiting alcohol use. ?Practicing safe sex. ?Taking low-dose aspirin every day. ?Taking vitamin and mineral supplements as recommended by your health care provider. ?What happens during an annual well check? ?The services and screenings done by your health care provider during your annual well check will depend on your age, overall health, lifestyle risk factors, and family history of disease. ?Counseling  ?Your health care provider may ask you questions about your: ?Alcohol use. ?Tobacco use. ?Drug use. ?Emotional well-being. ?Home and relationship well-being. ?Sexual activity. ?Eating  habits. ?History of falls. ?Memory and ability to understand (cognition). ?Work and work Astronomer. ?Reproductive health. ?Screening  ?You may have the following tests or measurements: ?Height, weight, and BMI. ?Blood pressure. ?Lipid and cholesterol levels. These may be checked every 5 years, or more frequently if you are over 74 years old. ?Skin check. ?Lung cancer screening. You may have this screening every year starting at age 30 if you have a 30-pack-year history of smoking and currently smoke or have quit within the past 15 years. ?Fecal occult blood test (FOBT) of the stool. You may have this test every year starting at age 37. ?Flexible sigmoidoscopy or colonoscopy. You may have a sigmoidoscopy every 5 years or a colonoscopy every 10 years starting at age 21. ?Hepatitis C blood test. ?Hepatitis B blood test. ?Sexually transmitted disease (STD) testing. ?Diabetes screening. This is done by checking your blood sugar (glucose) after you have not eaten for a while (fasting). You may have this done every 1-3 years. ?Bone density scan. This is done to screen for osteoporosis. You may have this done starting at age 76. ?Mammogram. This may be done every 1-2 years. Talk to your health care provider about how often you should have regular mammograms. ?Talk with your health care provider about your test results, treatment options, and if necessary, the need for more tests. ?Vaccines  ?Your health care provider may recommend certain vaccines, such as: ?Influenza vaccine. This is recommended every year. ?Tetanus, diphtheria, and acellular pertussis (Tdap, Td) vaccine. You may need a Td booster every 10 years. ?Zoster vaccine. You may need this after age 46. ?Pneumococcal 13-valent conjugate (PCV13) vaccine. One dose is recommended after age 51. ?Pneumococcal polysaccharide (PPSV23) vaccine. One dose is recommended after age 47. ?Talk to your health care provider about which  screenings and vaccines you need and how  often you need them. ?This information is not intended to replace advice given to you by your health care provider. Make sure you discuss any questions you have with your health care provider. ?Document Released: 09/29/2015 Document Revised: 05/22/2016 Document Reviewed: 07/04/2015 ?Elsevier Interactive Patient Education ? 2017 [place]. ? ?Fall Prevention in the Home ?Falls can cause injuries. They can happen to people of all ages. There are many things you can do to make your home safe and to help prevent falls. ?What can I do on the outside of my home? ?Regularly fix the edges of walkways and driveways and fix any cracks. ?Remove anything that might make you trip as you walk through a door, such as a raised step or threshold. ?Trim any bushes or trees on the path to your home. ?Use bright outdoor lighting. ?Clear any walking paths of anything that might make someone trip, such as rocks or tools. ?Regularly check to see if handrails are loose or broken. Make sure that both sides of any steps have handrails. ?Any raised decks and porches should have guardrails on the edges. ?Have any leaves, snow, or ice cleared regularly. ?Use sand or salt on walking paths during winter. ?Clean up any spills in your garage right away. This includes oil or grease spills. ?What can I do in the bathroom? ?Use night lights. ?Install grab bars by the toilet and in the tub and shower. Do not use towel bars as grab bars. ?Use non-skid mats or decals in the tub or shower. ?If you need to sit down in the shower, use a plastic, non-slip stool. ?Keep the floor dry. Clean up any water that spills on the floor as soon as it happens. ?Remove soap buildup in the tub or shower regularly. ?Attach bath mats securely with double-sided non-slip rug tape. ?Do not have throw rugs and other things on the floor that can make you trip. ?What can I do in the bedroom? ?Use night lights. ?Make sure that you have a light by your bed that is easy to  reach. ?Do not use any sheets or blankets that are too big for your bed. They should not hang down onto the floor. ?Have a firm chair that has side arms. You can use this for support while you get dressed. ?Do not have throw rugs and other things on the floor that can make you trip. ?What can I do in the kitchen? ?Clean up any spills right away. ?Avoid walking on wet floors. ?Keep items that you use a lot in easy-to-reach places. ?If you need to reach something above you, use a strong step stool that has a grab bar. ?Keep electrical cords out of the way. ?Do not use floor polish or wax that makes floors slippery. If you must use wax, use non-skid floor wax. ?Do not have throw rugs and other things on the floor that can make you trip. ?What can I do with my stairs? ?Do not leave any items on the stairs. ?Make sure that there are handrails on both sides of the stairs and use them. Fix handrails that are broken or loose. Make sure that handrails are as long as the stairways. ?Check any carpeting to make sure that it is firmly attached to the stairs. Fix any carpet that is loose or worn. ?Avoid having throw rugs at the top or bottom of the stairs. If you do have throw rugs, attach them to the floor with carpet  tape. ?Make sure that you have a light switch at the top of the stairs and the bottom of the stairs. If you do not have them, ask someone to add them for you. ?What else can I do to help prevent falls? ?Wear shoes that: ?Do not have high heels. ?Have rubber bottoms. ?Are comfortable and fit you well. ?Are closed at the toe. Do not wear sandals. ?If you use a stepladder: ?Make sure that it is fully opened. Do not climb a closed stepladder. ?Make sure that both sides of the stepladder are locked into place. ?Ask someone to hold it for you, if possible. ?Clearly mark and make sure that you can see: ?Any grab bars or handrails. ?First and last steps. ?Where the edge of each step is. ?Use tools that help you move  around (mobility aids) if they are needed. These include: ?Canes. ?Walkers. ?Scooters. ?Crutches. ?Turn on the lights when you go into a dark area. Replace any light bulbs as soon as they burn out. ?Set up your fu

## 2021-12-25 ENCOUNTER — Telehealth: Payer: Self-pay | Admitting: *Deleted

## 2021-12-25 NOTE — Telephone Encounter (Signed)
Patient called back thinks she has app made. If not you can give her a call back later today she will be leaving house soon for appointment.  ?

## 2021-12-25 NOTE — Telephone Encounter (Signed)
Trying to reach patient to do add on to [employer].   Left a couple of messages for patient ? ?

## 2022-01-21 ENCOUNTER — Ambulatory Visit (INDEPENDENT_AMBULATORY_CARE_PROVIDER_SITE_OTHER): Payer: Medicare Other | Admitting: Family Medicine

## 2022-01-21 ENCOUNTER — Encounter: Payer: Self-pay | Admitting: Family Medicine

## 2022-01-21 VITALS — BP 118/62 | HR 75 | Temp 98.7°F | Ht 62.0 in | Wt 167.0 lb

## 2022-01-21 DIAGNOSIS — Z1231 Encounter for screening mammogram for malignant neoplasm of breast: Secondary | ICD-10-CM | POA: Diagnosis not present

## 2022-01-21 DIAGNOSIS — R7989 Other specified abnormal findings of blood chemistry: Secondary | ICD-10-CM | POA: Diagnosis not present

## 2022-01-21 DIAGNOSIS — Z1211 Encounter for screening for malignant neoplasm of colon: Secondary | ICD-10-CM | POA: Diagnosis not present

## 2022-01-21 DIAGNOSIS — E782 Mixed hyperlipidemia: Secondary | ICD-10-CM

## 2022-01-21 NOTE — Patient Instructions (Addendum)
Please call the location of your choice from the menu below to schedule your Mammogram and/or Bone Density appointment.   ? ? ? ?Alexandria ? ?[facility]   ?Phone:  [phone]   ?[place], [place] [address]                                            ?Services: 3D Mammogram and Bone Density ? ? ? ?Schedule a lab appointment in 6 months - come Well hydrated  ?

## 2022-01-21 NOTE — Progress Notes (Signed)
Annual Exam  ? ?Chief Complaint:  ?Chief Complaint  ?Patient presents with  ? Medicare Wellness  ? ? ?History of Present Illness:  ?Ms. Alexandria Mason is a 71 y.o. No obstetric history on file. who LMP was No LMP recorded. Patient is postmenopausal., presents today for her annual examination.   ? ? ?Nutrition ?She does get adequate calcium and Vitamin D in her diet. ?Diet: generally healthy, limits fried food ?Exercise: yard work, walking with her neighbor ? ? ? ?Social History  ? ?Tobacco Use  ?Smoking Status Never  ?Smokeless Tobacco Never  ? ?Social History  ? ?Substance and Sexual Activity  ?Alcohol Use Not Currently  ? Comment: rarely  ? ?Social History  ? ?Substance and Sexual Activity  ?Drug Use Never  ? ? ? ?General Health ?Dentist in the last year: No ?Eye doctor: yes ? ?Safety ?The patient wears seatbelts: yes.     ?The patient feels safe at home and in their relationships: yes. ? ? ?Menstrual:  ?Symptoms of menopause: no issures ? ?GYN ?She is not sexually active.  ? ? ?Breast Cancer Screening (Age 52-74):  ?There is no FH of breast cancer. There is no FH of ovarian cancer. BRCA screening Not Indicated.  ?Last Mammogram: never ?The patient does want a mammogram this year.  ? ? ?Colon Cancer Screening:  ?Age 65-75 yo - benefits outweigh the risk. Adults 77-85 yo who have never been screened benefit.  ?Benefits: 134000 people in 2016 will be diagnosed and 49,000 will die - early detection helps ?Harms: Complications 2/2 to colonoscopy ?High Risk (Colonoscopy): genetic disorder (Lynch syndrome or familial adenomatous polyposis), personal hx of IBD, previous adenomatous polyp, or previous colorectal cancer, FamHx start 10 years before the age at diagnosis, increased in males and black race ? ?Options:  ?FIT - looks for hemoglobin (blood in the stool) - specific and fairly sensitive - must be done annually ?Cologuard - looks for DNA and blood - more sensitive - therefore can have more false positives, every  3 years ?Colonoscopy - every 10 years if normal - sedation, bowl prep, must have someone drive you ? ?Shared decision making and the patient had decided to do FOBT. ? ? ?Social History  ? ?Tobacco Use  ?Smoking Status Never  ?Smokeless Tobacco Never  ? ? ?Weight ?Wt Readings from Last 3 Encounters:  ?01/21/22 167 lb (75.8 kg)  ?11/05/21 165 lb (74.8 kg)  ?06/05/21 161 lb 8 oz (73.3 kg)  ? ?Patient has high BMI  ?BMI Readings from Last 1 Encounters:  ?01/21/22 30.54 kg/m?  ? ? ? ?Chronic disease screening ?Blood pressure monitoring:  ?BP Readings from Last 3 Encounters:  ?01/21/22 118/62  ?11/05/21 118/64  ?06/05/21 112/80  ? ? ?Lipid Monitoring: Indication for screening: age >29, obesity, diabetes, family hx, CV risk factors.  ?Lipid screening: Yes ? ?Lab Results  ?Component Value Date  ? CHOL 159 11/05/2021  ? HDL 53.90 11/05/2021  ? [place] 72 11/05/2021  ? TRIG 167.0 (H) 11/05/2021  ? CHOLHDL 3 11/05/2021  ? ? ? ?Diabetes Screening: age >85, overweight, family hx, PCOS, hx of gestational diabetes, at risk ethnicity ?Diabetes Screening screening: Yes, Not Indicated ? ?Lab Results  ?Component Value Date  ? HGBA1C 5.6 10/20/2018  ? ? ? ?Past Medical History:  ?Diagnosis Date  ? Acute hypoxemic respiratory failure due to COVID-19 [facility]) 08/18/2020  ? Gastroenteritis due to COVID-19 virus 08/18/2020  ? ? ?Past Surgical History:  ?Procedure Laterality Date  ? BUNIONECTOMY    ?  x 2-bilateral feet- different years for each one  ? CATARACT EXTRACTION Left 2011  ? oral surgeries    ? WRIST FRACTURE SURGERY Right 08/2006  ? metal plate present  ? ? ?Prior to Admission medications   ?Medication Sig Start Date End Date Taking? Authorizing Provider  ?atorvastatin (LIPITOR) 20 MG tablet Take 1 tablet (20 mg total) by mouth daily. 11/05/21  Yes Eugenia Pancoast, FNP  ?Naproxen Sodium 220 MG CAPS Take by mouth daily as needed.   Yes [provider]  ?propranolol (INDERAL) 10 MG tablet Take 1 tablet (10 mg total) by mouth daily  as needed (take 1 hour before anxious event). 11/05/21  Yes Eugenia Pancoast, FNP  ? ? ?Allergies  ?Allergen Reactions  ? Oxycodone-Acetaminophen   ?  Hallucinations  ? Sulfa Antibiotics   ?  Upset stomach  ? ? ?Gynecologic History: No LMP recorded. Patient is postmenopausal. ? ?Obstetric History: No obstetric history on file. ? ?Social History  ? ?Socioeconomic History  ? Marital status: Divorced  ?  Spouse name: Not on file  ? Number of children: 1  ? Years of education: some college  ? Highest education level: Not on file  ?Occupational History  ? Not on file  ?Tobacco Use  ? Smoking status: Never  ? Smokeless tobacco: Never  ?Vaping Use  ? Vaping Use: Never used  ?Substance and Sexual Activity  ? Alcohol use: Not Currently  ?  Comment: rarely  ? Drug use: Never  ? Sexual activity: Not Currently  ?Other Topics Concern  ? Not on file  ?Social History Narrative  ? Living on social security, retired from retail  ? Lives alone  ? One daughter - Lenna Sciara   ? Enjoys: going to the gym, walking with her neighbor  ? Exercise: gym 3 times a week  ? Social support - yes, good friends, daughter  ? Diet: pretty healthy - salads, meats, chicken  ? ?Social Determinants of Health  ? ?Financial Resource Strain: Low Risk   ? Difficulty of Paying Living Expenses: Not very hard  ?Food Insecurity: No Food Insecurity  ? Worried About Charity fundraiser in the Last Year: Never true  ? Ran Out of Food in the Last Year: Never true  ?Transportation Needs: No Transportation Needs  ? Lack of Transportation (Medical): No  ? Lack of Transportation (Non-Medical): No  ?Physical Activity: Insufficiently Active  ? Days of Exercise per Week: 3 days  ? Minutes of Exercise per Session: 40 min  ?Stress: No Stress Concern Present  ? Feeling of Stress : Only a little  ?Social Connections: Moderately Integrated  ? Frequency of Communication with Friends and Family: More than three times a week  ? Frequency of Social Gatherings with Friends and Family:  Twice a week  ? Attends Religious Services: More than 4 times per year  ? Active Member of Clubs or Organizations: Yes  ? Attends Archivist Meetings: More than 4 times per year  ? Marital Status: Divorced  ?Intimate Partner Violence: Not At Risk  ? Fear of Current or Ex-Partner: No  ? Emotionally Abused: No  ? Physically Abused: No  ? Sexually Abused: No  ? ? ?Family History  ?Problem Relation Age of Onset  ? Congestive Heart Failure Mother   ? Heart disease Father   ? Heart attack Father 73  ? Heart disease Brother   ? Heart attack Paternal Grandfather   ? ? ?Review of Systems  ?Constitutional:  Negative  for chills and fever.  ?HENT:  Negative for congestion and sore throat.   ?Eyes:  Negative for blurred vision and double vision.  ?Respiratory:  Negative for shortness of breath.   ?Cardiovascular:  Negative for chest pain.  ?Gastrointestinal:  Negative for heartburn, nausea and vomiting.  ?Genitourinary: Negative.   ?Musculoskeletal: Negative.  Negative for myalgias.  ?Skin:  Negative for rash.  ?Neurological:  Negative for dizziness and headaches.  ?Endo/Heme/Allergies:  Does not bruise/bleed easily.  ?Psychiatric/Behavioral:  Negative for depression. The patient is not nervous/anxious.    ? ?Physical Exam ?BP 118/62   Pulse 75   Temp 98.7 ?F (37.1 ?C) (Oral)   Ht '5\' 2"'  (1.575 m)   Wt 167 lb (75.8 kg)   SpO2 97%   BMI 30.54 kg/m?   ? ?BP Readings from Last 3 Encounters:  ?01/21/22 118/62  ?11/05/21 118/64  ?06/05/21 112/80  ? ? ? ? ?Physical Exam ?Constitutional:   ?   General: She is not in acute distress. ?   Appearance: She is well-developed. She is not diaphoretic.  ?HENT:  ?   Head: Normocephalic and atraumatic.  ?   Right Ear: External ear normal.  ?   Left Ear: External ear normal.  ?   Nose: Nose normal.  ?Eyes:  ?   General: No scleral icterus. ?   Conjunctiva/sclera: Conjunctivae normal.  ?Cardiovascular:  ?   Rate and Rhythm: Normal rate and regular rhythm.  ?   Heart sounds: No murmur  heard. ?Pulmonary:  ?   Effort: Pulmonary effort is normal. No respiratory distress.  ?   Breath sounds: Normal breath sounds. No wheezing.  ?Abdominal:  ?   General: Bowel sounds are normal. There is no dist

## 2022-04-03 ENCOUNTER — Other Ambulatory Visit: Payer: Self-pay | Admitting: Family

## 2022-04-03 DIAGNOSIS — E785 Hyperlipidemia, unspecified: Secondary | ICD-10-CM

## 2022-07-25 ENCOUNTER — Other Ambulatory Visit: Payer: Self-pay | Admitting: Family Medicine

## 2022-07-25 DIAGNOSIS — E785 Hyperlipidemia, unspecified: Secondary | ICD-10-CM

## 2022-07-25 NOTE — Telephone Encounter (Signed)
  Encourage patient to contact the pharmacy for refills or they can request refills through [facility]  Did the patient contact the pharmacy: Yes  LAST APPOINTMENT DATE: 02/10/2022  NEXT APPOINTMENT DATE: n/a  MEDICATION: atorvastatin (LIPITOR) 20 MG tablet   Is the patient out of medication? Yes  PHARMACY: [id number] - [place], [place] - [address]   Comment: Patient stated she will call back later for [facility] appointment.   Let patient know to contact pharmacy at the end of the day to make sure medication is ready.  Please notify patient to allow 48-72 hours to process

## 2022-07-29 MED ORDER — ATORVASTATIN CALCIUM 20 MG PO TABS: 20.0000 mg | ORAL_TABLET | Freq: Every day | ORAL | 0 refills | Status: DC

## 2022-07-29 NOTE — Telephone Encounter (Signed)
Please call patient to set up toc appointment. Let me know when scheduled so that I can submit for refill.

## 2022-07-29 NOTE — Telephone Encounter (Signed)
Please advise on refill. Patient has not set up [facility] appointment.

## 2022-07-29 NOTE — Addendum Note (Signed)
Addended by: Donnamarie Poag on: 07/29/2022 03:12 PM   Modules accepted: Orders

## 2022-07-29 NOTE — Telephone Encounter (Signed)
Patient will call back to schedule ?

## 2022-09-15 IMAGING — US US ABDOMEN COMPLETE
1 series · 15 of 25 positions shown · non-contrast
Comparison: None.

CLINICAL DATA: Elevated liver function tests.

EXAM:
ABDOMEN ULTRASOUND COMPLETE

[Series 1: us abdomen complete · 15 of 86 slices shown]
[im 1/86]
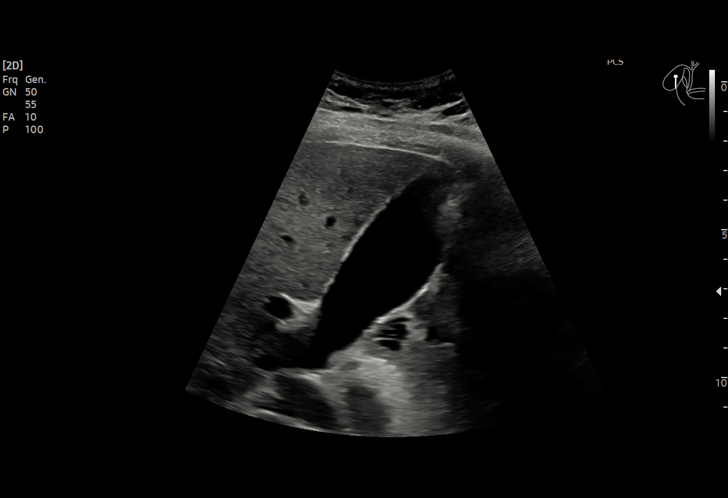
[im 8/86]
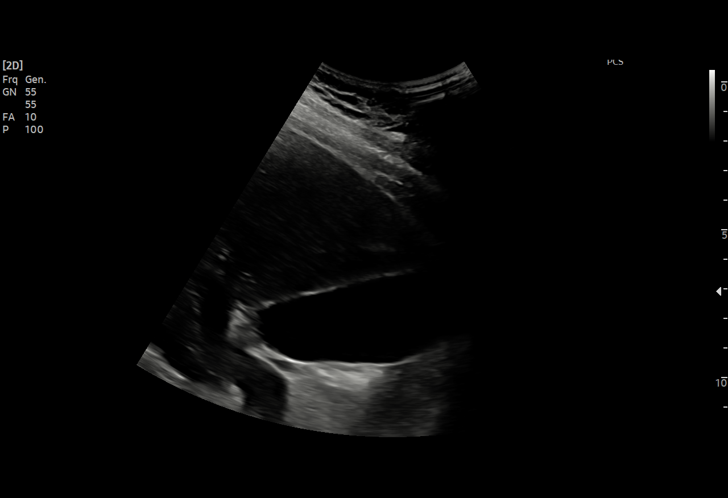
[im 15/86]
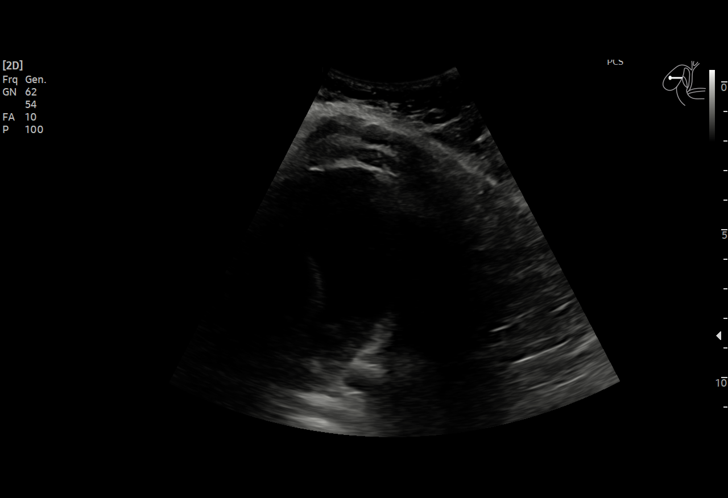
[im 18/86]
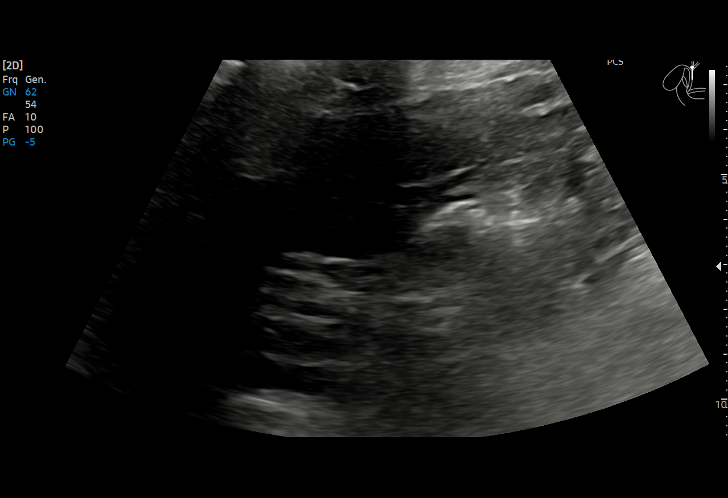
[im 25/86]
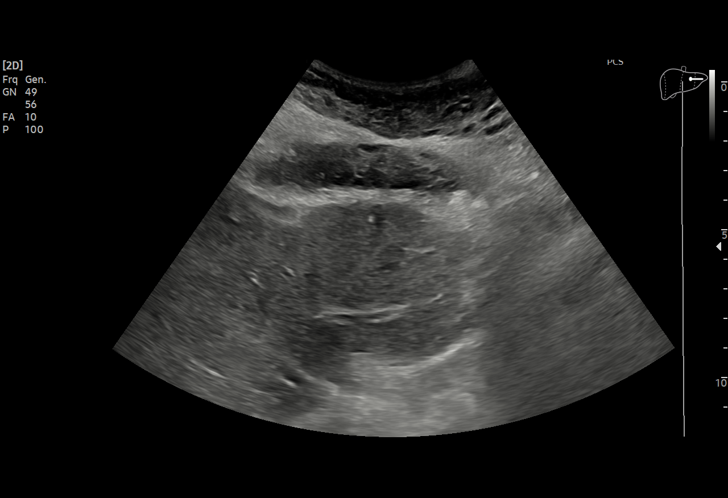
[im 32/86]
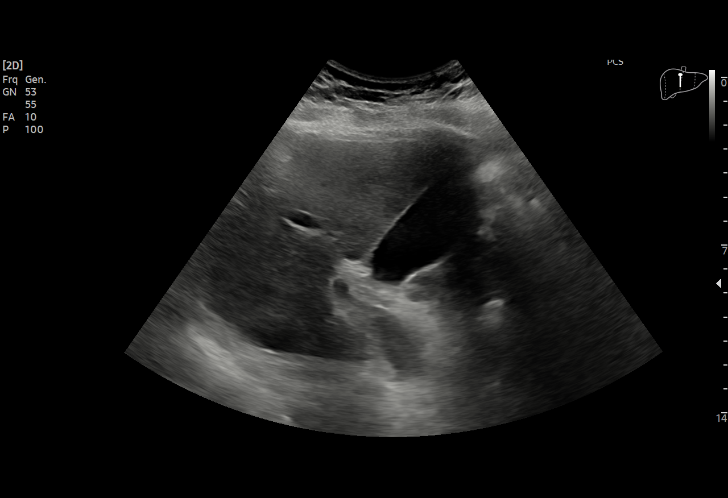
[im 36/86]
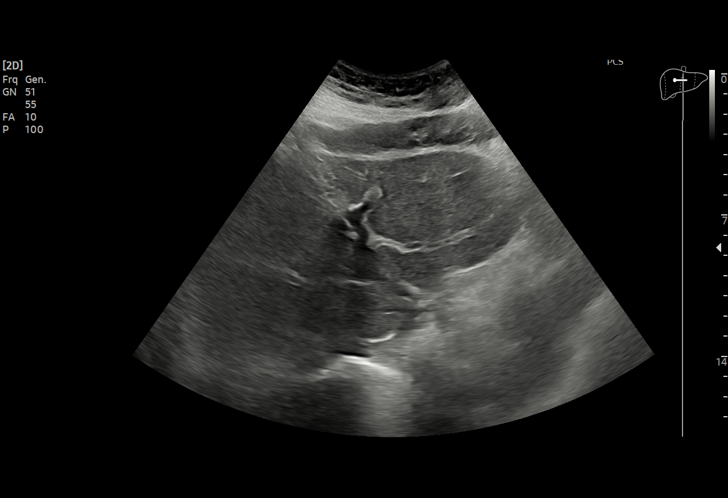
[im 43/86]
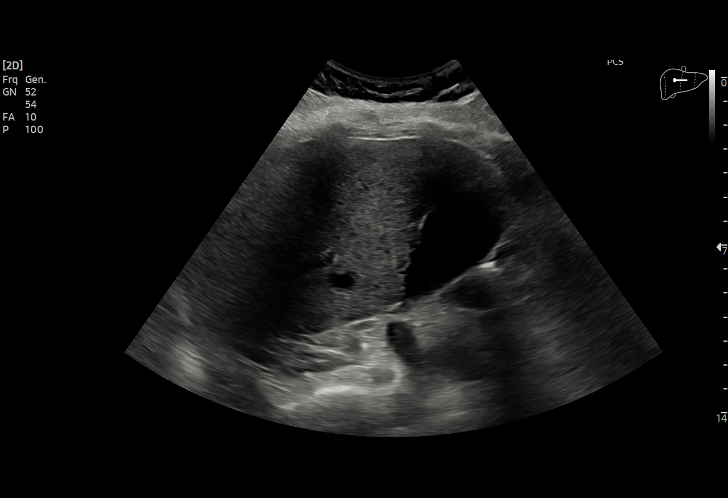
[im 50/86]
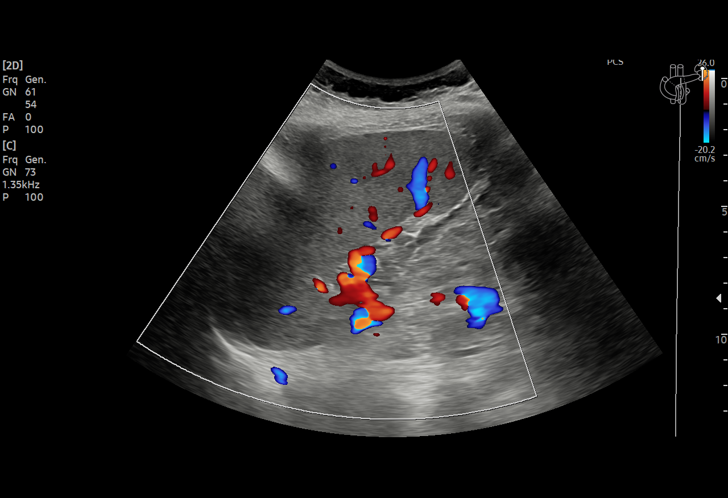
[im 54/86]
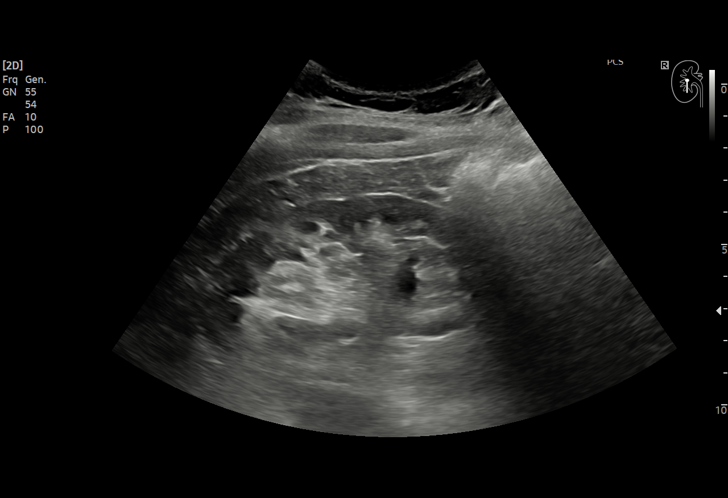
[im 61/86]
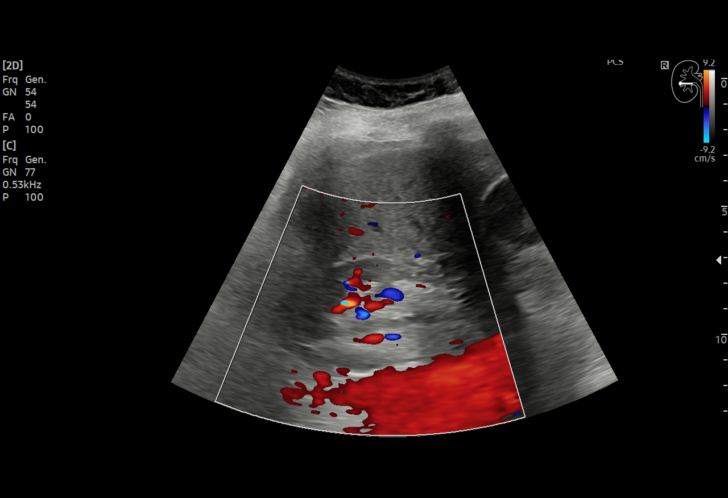
[im 68/86]
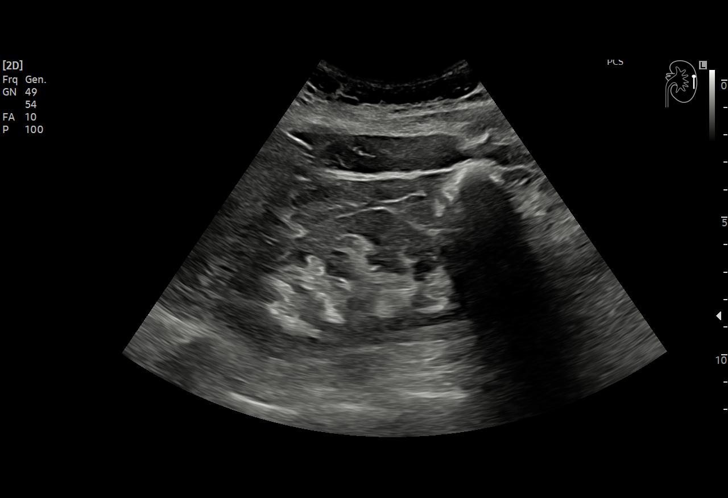
[im 71/86]
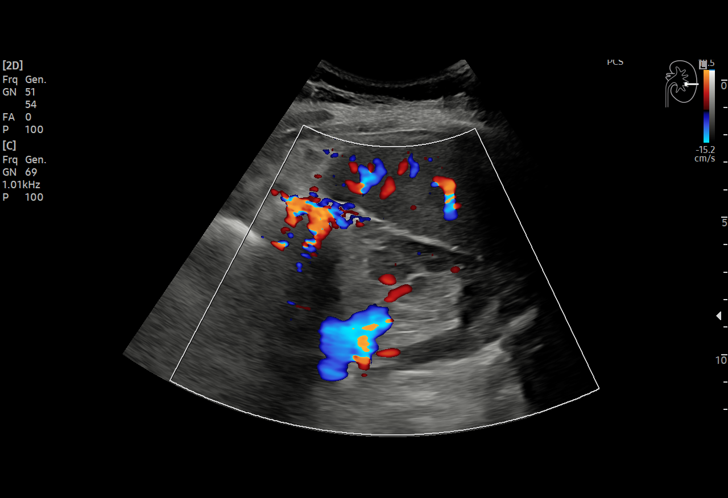
[im 78/86]
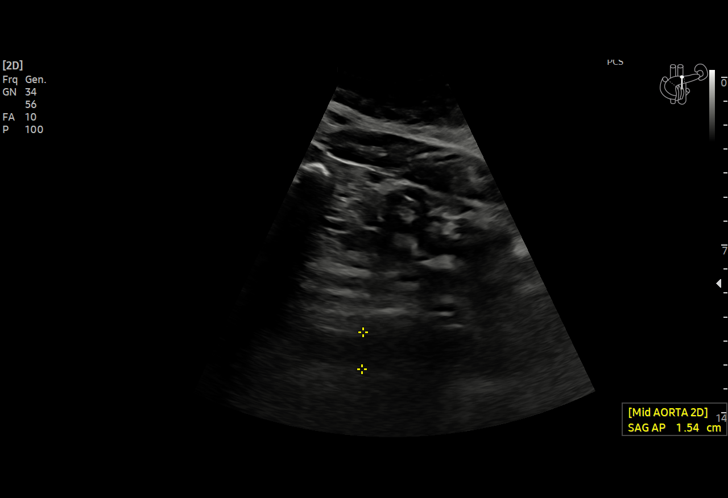
[im 86/86]
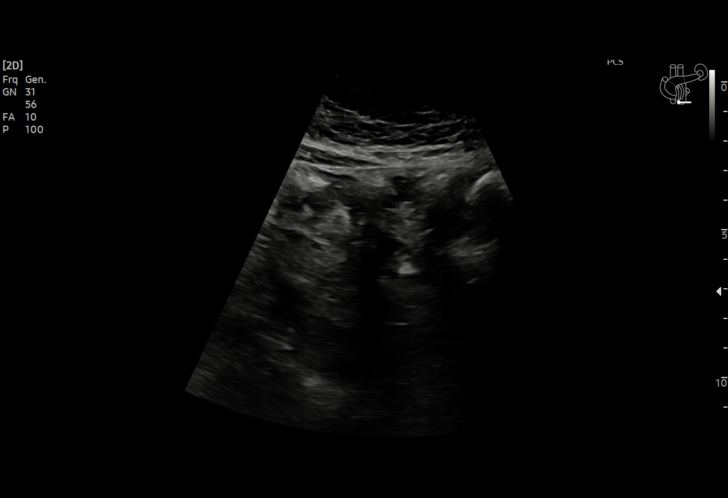

[15 of 25 positions shown; findings below may reference images not displayed]

FINDINGS: Gallbladder: No gallstones or wall thickening visualized (1.4 mm).
No sonographic Murphy sign noted by sonographer.

Common bile duct: Diameter: 3.2 mm

Liver: No focal lesion identified. Within normal limits in
parenchymal echogenicity. Portal vein is patent on color Doppler
imaging with normal direction of blood flow towards the liver.

IVC: No abnormality visualized.

Pancreas: Poorly visualized secondary to overlying bowel gas.

Spleen: Size (9.6 cm) and appearance within normal limits.

Right Kidney: Length: 9.2 cm. Echogenicity within normal limits. No
mass or hydronephrosis visualized.

Left Kidney: Length: 9.6 cm. Echogenicity within normal limits. No
mass or hydronephrosis visualized.

Abdominal aorta: No aneurysm visualized (2.0 cm in AP diameter).

Other findings: The study is limited secondary to overlying bowel
gas.
IMPRESSION: 1. Limited evaluation of the pancreas secondary to overlying bowel
gas.
2. Otherwise, normal abdominal ultrasound.

## 2022-10-25 ENCOUNTER — Other Ambulatory Visit: Payer: Self-pay | Admitting: Family

## 2022-10-25 DIAGNOSIS — E785 Hyperlipidemia, unspecified: Secondary | ICD-10-CM

## 2022-11-19 NOTE — Telephone Encounter (Signed)
Patient has a TOC scheduled.

## 2022-11-28 ENCOUNTER — Other Ambulatory Visit: Payer: Self-pay | Admitting: Family

## 2022-11-28 DIAGNOSIS — E785 Hyperlipidemia, unspecified: Secondary | ICD-10-CM

## 2022-11-28 MED ORDER — ATORVASTATIN CALCIUM 20 MG PO TABS: 20.0000 mg | ORAL_TABLET | Freq: Every day | ORAL | 3 refills | Status: DC

## 2023-01-30 ENCOUNTER — Ambulatory Visit (INDEPENDENT_AMBULATORY_CARE_PROVIDER_SITE_OTHER): Payer: Medicare Other | Admitting: Family

## 2023-01-30 ENCOUNTER — Encounter: Payer: Self-pay | Admitting: Family

## 2023-01-30 VITALS — BP 124/82 | HR 72 | Temp 97.6°F | Ht 62.0 in | Wt 171.8 lb

## 2023-01-30 DIAGNOSIS — R944 Abnormal results of kidney function studies: Secondary | ICD-10-CM

## 2023-01-30 DIAGNOSIS — E782 Mixed hyperlipidemia: Secondary | ICD-10-CM | POA: Diagnosis not present

## 2023-01-30 DIAGNOSIS — F419 Anxiety disorder, unspecified: Secondary | ICD-10-CM | POA: Diagnosis not present

## 2023-01-30 DIAGNOSIS — D75839 Thrombocytosis, unspecified: Secondary | ICD-10-CM

## 2023-01-30 DIAGNOSIS — R748 Abnormal levels of other serum enzymes: Secondary | ICD-10-CM

## 2023-01-30 DIAGNOSIS — R17 Unspecified jaundice: Secondary | ICD-10-CM | POA: Diagnosis not present

## 2023-01-30 DIAGNOSIS — H548 Legal blindness, as defined in USA: Secondary | ICD-10-CM

## 2023-01-30 DIAGNOSIS — R42 Dizziness and giddiness: Secondary | ICD-10-CM

## 2023-01-30 DIAGNOSIS — H9313 Tinnitus, bilateral: Secondary | ICD-10-CM

## 2023-01-30 LAB — COMPREHENSIVE METABOLIC PANEL
ALT: 10 U/L (ref 0–35)
AST: 17 U/L (ref 0–37)
Albumin: 4.1 g/dL (ref 3.5–5.2)
Alkaline Phosphatase: 144 U/L — ABNORMAL HIGH (ref 39–117)
BUN: 11 mg/dL (ref 6–23)
CO2: 28 mEq/L (ref 19–32)
Calcium: 9.5 mg/dL (ref 8.4–10.5)
Chloride: 107 mEq/L (ref 96–112)
Creatinine, Ser: 0.98 mg/dL (ref 0.40–1.20)
GFR: 57.37 mL/min — ABNORMAL LOW (ref >60.00)
Glucose, Bld: 100 mg/dL — ABNORMAL HIGH (ref 70–99)
Potassium: 3.8 mEq/L (ref 3.5–5.1)
Sodium: 143 mEq/L (ref 135–145)
Total Bilirubin: 1.1 mg/dL (ref 0.2–1.2)
Total Protein: 6.5 g/dL (ref 6.0–8.3)

## 2023-01-30 LAB — CBC WITH DIFFERENTIAL/PLATELET
Basophils Absolute: 0 10*3/uL (ref 0.0–0.1)
Basophils Relative: 0.7 % (ref 0.0–3.0)
Eosinophils Absolute: 0.1 10*3/uL (ref 0.0–0.7)
Eosinophils Relative: 1.8 % (ref 0.0–5.0)
HCT: 40.8 % (ref 36.0–46.0)
Hemoglobin: 13.6 g/dL (ref 12.0–15.0)
Lymphocytes Relative: 15.6 % (ref 12.0–46.0)
Lymphs Abs: 0.9 10*3/uL (ref 0.7–4.0)
MCHC: 33.2 g/dL (ref 30.0–36.0)
MCV: 91.3 fl (ref 78.0–100.0)
Monocytes Absolute: 0.5 10*3/uL (ref 0.1–1.0)
Monocytes Relative: 8 % (ref 3.0–12.0)
Neutro Abs: 4.5 10*3/uL (ref 1.4–7.7)
Neutrophils Relative %: 73.9 % (ref 43.0–77.0)
Platelets: 198 10*3/uL (ref 150.0–400.0)
RBC: 4.47 Mil/uL (ref 3.87–5.11)
RDW: 12.7 % (ref 11.5–15.5)
WBC: 6.1 10*3/uL (ref 4.0–10.5)

## 2023-01-30 LAB — LIPID PANEL
Cholesterol: 116 mg/dL (ref 0–200)
HDL: 41.1 mg/dL (ref >39.00)
LDL Cholesterol: 52 mg/dL (ref 0–99)
NonHDL: 75.01
Total CHOL/HDL Ratio: 3
Triglycerides: 115 mg/dL (ref 0.0–149.0)
VLDL: 23 mg/dL (ref 0.0–40.0)

## 2023-01-30 MED ORDER — MECLIZINE HCL 12.5 MG PO TABS: 12.5000 mg | ORAL_TABLET | Freq: Three times a day (TID) | ORAL | 0 refills | Status: AC | PRN

## 2023-01-30 MED ORDER — PROPRANOLOL HCL 10 MG PO TABS: 10.0000 mg | ORAL_TABLET | Freq: Every day | ORAL | 2 refills | Status: DC | PRN

## 2023-01-30 NOTE — Assessment & Plan Note (Signed)
Improved last lab draw will repeat cbc again today.

## 2023-01-30 NOTE — Assessment & Plan Note (Signed)
Continue atorvastatin 20 mg nightly  Ordered lipid panel, pending results. Work on low cholesterol diet and exercise as tolerated

## 2023-01-30 NOTE — Assessment & Plan Note (Signed)
Stable refill propanolol use prn  Pt does not feel she needs daily medication  If in future will consider ssri

## 2023-01-30 NOTE — Patient Instructions (Signed)
A referral was placed today for ENT Please let us know if you have not heard back within 2 weeks about the referral.  Stop by the lab prior to leaving today. I will notify you of your results once received.   Meclizine as needed for dizziness. Rise slowly when these symptoms occur.    Regards,   Mort Sawyers FNP-C
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
## 2023-01-30 NOTE — Assessment & Plan Note (Addendum)
Dizziness suspected vertigo from allergies/sinus pressure which has since resolved. Sending in meclizine to use prn symptoms.  Rise slowly when this occurs.  Orthostatic vital signs in office reassuring.

## 2023-01-31 ENCOUNTER — Other Ambulatory Visit: Payer: Self-pay | Admitting: Family

## 2023-01-31 DIAGNOSIS — R7989 Other specified abnormal findings of blood chemistry: Secondary | ICD-10-CM

## 2023-01-31 NOTE — Progress Notes (Signed)
Liver function elevated, looks like there is some history to elevated liver function? Has seen seen anyone in the past, like GI for her elevated liver function?  Kidney function stable.  Cholesterol looks good. U/s abdomen done in 2/23 normal findings.   Terri, can we add hepatitis panel?

## 2023-02-03 NOTE — Progress Notes (Signed)
Can we ask pt if she has seen anyone (such as GI) in the past for elevated alkaline phos? (Liver function test)

## 2023-02-04 NOTE — Addendum Note (Signed)
Addended by: Mort Sawyers on: 02/04/2023 02:49 PM   Modules accepted: Orders

## 2023-02-04 NOTE — Progress Notes (Signed)
Referral placed for gi  

## 2023-02-25 ENCOUNTER — Encounter: Payer: Self-pay | Admitting: Physician Assistant

## 2023-02-25 ENCOUNTER — Ambulatory Visit: Payer: Medicare Other | Admitting: Physician Assistant

## 2023-02-25 VITALS — BP 113/67 | HR 62 | Temp 97.8°F | Ht 62.5 in | Wt 173.0 lb

## 2023-02-25 DIAGNOSIS — R17 Unspecified jaundice: Secondary | ICD-10-CM | POA: Diagnosis not present

## 2023-02-25 DIAGNOSIS — R748 Abnormal levels of other serum enzymes: Secondary | ICD-10-CM | POA: Diagnosis not present

## 2023-02-25 NOTE — Progress Notes (Signed)
Celso Amy, PA-C [address]  [address]  [place], [place] [address]  Main: [phone]  Fax: [phone]   Gastroenterology Consultation  Referring Provider:     Mort Sawyers, FNP Primary Care Physician:  Mort Sawyers, FNP Primary Gastroenterologist:  Celso Amy, PA-C / Dr. Wyline Mood  Reason for Consultation:     Elevated bilirubin and alk phos        HPI:   Alexandria Mason is a 72 y.o. y/o female referred for consultation & management  by Mort Sawyers, FNP.    Labs 01/30/2023 showed elevated alkaline phosphatase 144.  Normal total bilirubin 1.5, AST 17, ALT 10. Labs 10/2021 showed elevated alkaline phosphatase 191, total bilirubin 1.5.  Normal ferritin and TSH. Labs 05/2021 showed elevated alkaline phosphatase 154, total bilirubin 1.6. HIV negative in 2021.  HCV negative in 2020.  Complete abdominal ultrasound 10/2021 showed normal gallbladder with no gallstones.  CBD 3.2 mm, normal.  Liver was normal.  No liver lesions.  Normal echogenicity.  Limited evaluation of pancreas, otherwise normal ultrasound.  Denies alcohol or tobacco use.  No family history of liver disease.  She denies any GI symptoms such as abdominal pain, weight loss, diarrhea, constipation, or rectal bleeding.  Has occasional heartburn which is relieved with OTC Prilosec.  No new medications.  Has anxiety and takes propranolol for that.  No previous GI evaluation or colonoscopy.  No family history of colon cancer.  Past Medical History:  Diagnosis Date   Acute hypoxemic respiratory failure due to COVID-19 [facility]) 08/18/2020   Gastroenteritis due to COVID-19 virus 08/18/2020    Past Surgical History:  Procedure Laterality Date   BUNIONECTOMY     x 2-bilateral feet- different years for each one   CATARACT EXTRACTION Left 2011   oral surgeries     WRIST FRACTURE SURGERY Right 08/2006   metal plate present    Prior to Admission medications   Medication Sig Start Date End Date Taking?  Authorizing Provider  atorvastatin (LIPITOR) 20 MG tablet Take 1 tablet (20 mg total) by mouth daily. 11/28/22   Mort Sawyers, FNP  meclizine (ANTIVERT) 12.5 MG tablet Take 1 tablet (12.5 mg total) by mouth 3 (three) times daily as needed for dizziness. 01/30/23   Mort Sawyers, FNP  Naproxen Sodium 220 MG CAPS Take by mouth daily as needed.    [provider]  propranolol (INDERAL) 10 MG tablet Take 1 tablet (10 mg total) by mouth daily as needed (take 1 hour before anxious event). 01/30/23   Mort Sawyers, FNP    Family History  Problem Relation Age of Onset   Congestive Heart Failure Mother    Heart disease Father    Heart attack Father 23   Heart disease Brother    Heart attack Paternal Grandfather      Social History   Tobacco Use   Smoking status: Never   Smokeless tobacco: Never  Vaping Use   Vaping Use: Never used  Substance Use Topics   Alcohol use: Not Currently    Comment: rarely   Drug use: Never    Allergies as of 02/25/2023 - Review Complete 01/30/2023  Allergen Reaction Noted   Oxycodone-acetaminophen Other (See Comments) 04/10/2015   Sulfa antibiotics  04/10/2015    Review of Systems:    All systems reviewed and negative except where noted in HPI.   Physical Exam:  There were no vitals taken for this visit. No LMP recorded. Patient is postmenopausal. Psych:  Alert and  cooperative. Normal mood and affect. General:   Alert,  Well-developed, well-nourished, pleasant and cooperative in NAD Head:  Normocephalic and atraumatic. Eyes:  Sclera clear, no icterus.   Conjunctiva pink. Neck:  Supple; no masses or thyromegaly. Lungs:  Respirations even and unlabored.  Clear throughout to auscultation.   No wheezes, crackles, or rhonchi. No acute distress. Heart:  Regular rate and rhythm; no murmurs, clicks, rubs, or gallops. Abdomen:  Normal bowel sounds.  No bruits.  Soft, and non-distended without masses, hepatosplenomegaly or hernias noted.  No  Tenderness.  No guarding or rebound tenderness.    Neurologic:  Alert and oriented x3;  grossly normal neurologically. Psych:  Alert and cooperative. Normal mood and affect.  Imaging Studies: No results found.  Assessment and Plan:   [name] is a 72 y.o. y/o female has been referred for mildly elevated alkaline phosphatase and total bilirubin.  I am ordering more lab work for further evaluation.  Complete abdominal ultrasound was normal.  No evidence of hepatic steatosis or gallstones.  She does not drink alcohol.  Alkaline phosphatase mildly elevated Labs: Alkaline phosphatase isoenzymes, GGT, intact PTH and calcium, hepatic panel, ANA, AMA, ASMA, viral hepatitis A/B/C, TSH.   2.   Total bilirubin mildly elevated; recently normal.  Labs: Hepatic Panal, ANA, AMA, ASMA, viral hepatitis A/B/C, iron panel, ferritin.  3.   Colon cancer screening  Recommended patient schedule a screening colonoscopy or Cologuard stool test.  Patient adamantly declined to schedule a colonoscopy or Cologuard.  Colon cancer screening guidelines were discussed at length.  Follow up in 4 - 6 weeks  Celso Amy, PA-C

## 2023-02-26 ENCOUNTER — Telehealth: Payer: Self-pay

## 2023-02-26 LAB — HEPATITIS C ANTIBODY: Hep C Virus Ab: NONREACTIVE

## 2023-02-26 LAB — IRON,TIBC AND FERRITIN PANEL: Ferritin: 118 ng/mL (ref 15–150)

## 2023-02-26 LAB — HEPATIC FUNCTION PANEL: Bilirubin Total: 1.2 mg/dL (ref 0.0–1.2)

## 2023-02-26 LAB — HEPATITIS B CORE ANTIBODY, TOTAL: Hep B Core Total Ab: NEGATIVE

## 2023-02-26 NOTE — Progress Notes (Signed)
I will see Alexandria Sawyers, FNP to follow up , with elevated PTH and normal GGT , elevated alk phos is not from liver likely from bone

## 2023-02-26 NOTE — Telephone Encounter (Signed)
Left message for patient to return call to office to discuss lab results.   Please  call and notify patient lab results showed:  1.  Elevated PTH, concerning for hyperparathyroidism.  Refer back to PCP Limmie Patricia, FNP for follow-up.  2.  GGT is normal.  Elevated alkaline phosphatase is not from her liver.  Likely from bone.  Follow-up w/ PCP.  3.  Alkaline phosphatase is still elevated.  All other liver test are normal.  4.  TSH thyroid test is normal.  ANA is negative.  No evidence of autoimmune hepatitis.  5.  Hepatitis A/B/C labs are negative.  She is not immune to hepatitis A or B.  Schedule Twinrix vaccine.  6.  Iron is normal.  No evidence of hemochromatosis or iron overload.

## 2023-02-26 NOTE — Progress Notes (Signed)
These call and notify patient lab results showed: 1.  Elevated PTH, concerning for hyperparathyroidism.  Refer back to PCP Limmie Patricia, FNP for follow-up. 2.  GGT is normal.  Elevated alkaline phosphatase is not from her liver.  Likely from bone.  Follow-up w/ PCP. 3.  Alkaline phosphatase is still elevated.  All other liver test are normal. 4.  TSH thyroid test is normal.  ANA is negative.  No evidence of autoimmune hepatitis. 5.  Hepatitis A/B/C labs are negative.  She is not immune to hepatitis A or B.  Schedule Twinrix vaccine. 6.  Iron is normal.  No evidence of hemochromatosis or iron overload.

## 2023-02-27 NOTE — Telephone Encounter (Signed)
Pt returning your call to discuss her lab results.

## 2023-02-27 NOTE — Telephone Encounter (Signed)
Spoke with patient-she stated she has had the hep A/B vaccine-and she will follow up with PCP.

## 2023-02-27 NOTE — Progress Notes (Signed)
Appreciate the information, thank you.  Will evaluate pt in office, has she been advised to make f/u with me to discuss furterh?

## 2023-03-02 LAB — HEPATITIS B SURFACE ANTIBODY,QUALITATIVE: Hep B Surface Ab, Qual: NONREACTIVE

## 2023-03-02 LAB — HEPATIC FUNCTION PANEL
ALT: 11 IU/L (ref 0–32)
AST: 17 IU/L (ref 0–40)
Albumin: 4.4 g/dL (ref 3.8–4.8)
Alkaline Phosphatase: 220 IU/L — ABNORMAL HIGH (ref 44–121)
Bilirubin, Direct: 0.29 mg/dL (ref 0.00–0.40)
Total Protein: 6.4 g/dL (ref 6.0–8.5)

## 2023-03-02 LAB — IRON,TIBC AND FERRITIN PANEL
Iron Saturation: 29 % (ref 15–55)
Iron: 84 ug/dL (ref 27–139)
Total Iron Binding Capacity: 287 ug/dL (ref 250–450)
UIBC: 203 ug/dL (ref 118–369)

## 2023-03-02 LAB — HEPATITIS A ANTIBODY, TOTAL: hep A Total Ab: NEGATIVE

## 2023-03-02 LAB — ALKALINE PHOSPHATASE, ISOENZYMES
BONE FRACTION: 27 % (ref 14–68)
INTESTINAL FRAC.: 7 % (ref 0–18)
LIVER FRACTION: 66 % (ref 18–85)

## 2023-03-02 LAB — TSH: TSH: 2.73 u[IU]/mL (ref 0.450–4.500)

## 2023-03-02 LAB — HEPATITIS B SURFACE ANTIGEN: Hepatitis B Surface Ag: NEGATIVE

## 2023-03-02 LAB — PTH, INTACT AND CALCIUM
Calcium: 9.5 mg/dL (ref 8.7–10.3)
PTH: 80 pg/mL — ABNORMAL HIGH (ref 15–65)

## 2023-03-02 LAB — MITOCHONDRIAL/SMOOTH MUSCLE AB PNL
Mitochondrial Ab: <20 Units (ref 0.0–20.0)
Smooth Muscle Ab: 8 Units (ref 0–19)

## 2023-03-02 LAB — ANA: Anti Nuclear Antibody (ANA): NEGATIVE

## 2023-03-02 LAB — GAMMA GT: GGT: 49 IU/L (ref 0–60)

## 2023-03-04 ENCOUNTER — Ambulatory Visit (INDEPENDENT_AMBULATORY_CARE_PROVIDER_SITE_OTHER): Payer: Medicare Other | Admitting: Family

## 2023-03-04 ENCOUNTER — Encounter: Payer: Self-pay | Admitting: Family

## 2023-03-04 ENCOUNTER — Other Ambulatory Visit: Payer: Self-pay | Admitting: Family

## 2023-03-04 VITALS — BP 116/62 | HR 61 | Temp 97.6°F | Ht 62.5 in | Wt 170.0 lb

## 2023-03-04 DIAGNOSIS — R7989 Other specified abnormal findings of blood chemistry: Secondary | ICD-10-CM

## 2023-03-04 DIAGNOSIS — R748 Abnormal levels of other serum enzymes: Secondary | ICD-10-CM | POA: Diagnosis not present

## 2023-03-04 LAB — VITAMIN D 25 HYDROXY (VIT D DEFICIENCY, FRACTURES): VITD: 10.74 ng/mL — ABNORMAL LOW (ref 30.00–100.00)

## 2023-03-04 NOTE — Progress Notes (Signed)
Established Patient Office Visit  Subjective:      CC:  Chief Complaint  Patient presents with   Thyroid Problem    Was told by by GI to follow up with PCP about TSH     HPI: Alexandria Mason is a 72 y.o. female presenting on 03/04/2023 for Thyroid Problem (Was told by by GI to follow up with PCP about TSH ) . Recently seen by Gi, Dr Tobi Bastos, and was found to have PTH elevation. Vitamin D not yet assessed, calcium not elevated. She denies constipation, muscle weakness, fatigue, weight changes, nausea and or vomiting.   GGT is normal for elevated alk phos per GI, suspect more bone etiology. Pt denies any bone pains or anything abnormal such as that. No increased fatigue except after over exerting herself. U/s abdomen 11/13/21 without acute findings.  Last metabolic panel Lab Results  Component Value Date   GLUCOSE 100 (H) 01/30/2023   NA 143 01/30/2023   K 3.8 01/30/2023   CL 107 01/30/2023   CO2 28 01/30/2023   BUN 11 01/30/2023   CREATININE 0.98 01/30/2023   GFRNONAA >60 08/23/2020   CALCIUM 9.5 02/25/2023   PROT 6.4 02/25/2023   ALBUMIN 4.4 02/25/2023   BILITOT 1.2 02/25/2023   ALKPHOS 220 (H) 02/25/2023   AST 17 02/25/2023   ALT 11 02/25/2023   ANIONGAP 7 08/23/2020      Social history:  Relevant past medical, surgical, family and social history reviewed and updated as indicated. Interim medical history since our last visit reviewed.  Allergies and medications reviewed and updated.  DATA REVIEWED: CHART IN EPIC     ROS: Negative unless specifically indicated above in HPI.    Current Outpatient Medications:    atorvastatin (LIPITOR) 20 MG tablet, Take 1 tablet (20 mg total) by mouth daily., Disp: 90 tablet, Rfl: 3   meclizine (ANTIVERT) 12.5 MG tablet, Take 1 tablet (12.5 mg total) by mouth 3 (three) times daily as needed for dizziness., Disp: 30 tablet, Rfl: 0   Naproxen Sodium 220 MG CAPS, Take by mouth daily as needed., Disp: , Rfl:    propranolol  (INDERAL) 10 MG tablet, Take 1 tablet (10 mg total) by mouth daily as needed (take 1 hour before anxious event)., Disp: 30 tablet, Rfl: 2      Objective:    BP 116/62   Pulse 61   Temp 97.6 F (36.4 C) (Temporal)   Ht 5' 2.5" (1.588 m)   Wt 170 lb (77.1 kg)   SpO2 96%   BMI 30.60 kg/m   Wt Readings from Last 3 Encounters:  03/04/23 170 lb (77.1 kg)  02/25/23 173 lb (78.5 kg)  01/30/23 171 lb 12.8 oz (77.9 kg)    Physical Exam Constitutional:      General: She is not in acute distress.    Appearance: Normal appearance. She is normal weight. She is not ill-appearing, toxic-appearing or diaphoretic.  HENT:     Head: Normocephalic.  Neck:     Thyroid: No thyromegaly or thyroid tenderness.  Cardiovascular:     Rate and Rhythm: Normal rate.  Pulmonary:     Effort: Pulmonary effort is normal.  Musculoskeletal:        General: Normal range of motion.  Neurological:     General: No focal deficit present.     Mental Status: She is alert and oriented to person, place, and time. Mental status is at baseline.  Psychiatric:        Mood  and Affect: Mood normal.        Behavior: Behavior normal.        Thought Content: Thought content normal.        Judgment: Judgment normal.           Assessment & Plan:  Elevated parathyroid hormone Assessment & Plan: Ordering vitamin d pending results.  No need to repeat pth right now due to recent testing.  Pending results. If vitamin D normal will consider referral to endocrinology.  Calcium is not currently elevated.   Orders: -     VITAMIN D 25 Hydroxy (Vit-D Deficiency, Fractures)  Elevated alkaline phosphatase level Assessment & Plan: Negative Gi workup  Ordering urine microalbumin 24 hour pending results.  Pth elevated, ordering vitamin d pending results. May be cause of elevated alk phos pending results of vitd d. May consider endo referral. To r/o bone etiology ordering bone scan pending results.       Return in about  1 week (around 03/11/2023), or if symptoms worsen or fail to improve.  Mort Sawyers, MSN, APRN, FNP-C [facility]

## 2023-03-04 NOTE — Assessment & Plan Note (Signed)
Ordering vitamin d pending results.  No need to repeat pth right now due to recent testing.  Pending results. If vitamin D normal will consider referral to endocrinology.  Calcium is not currently elevated.

## 2023-03-04 NOTE — Patient Instructions (Addendum)
  I have ordered imaging for you at [facility] outpatient diagnostic center for a bone scan.  This order has been sent over for you electronically.  Please call [phone] to schedule this appointment.   ------------------------------------  Stop by the lab prior to leaving today. I will notify you of your results once received.    Regards,   Mort Sawyers FNP-C

## 2023-03-04 NOTE — Assessment & Plan Note (Signed)
Negative Gi workup  Ordering urine microalbumin 24 hour pending results.  Pth elevated, ordering vitamin d pending results. May be cause of elevated alk phos pending results of vitd d. May consider endo referral. To r/o bone etiology ordering bone scan pending results.

## 2023-03-04 NOTE — Assessment & Plan Note (Addendum)
Ordering vitamin d to r/o deficiency  Pending results.  If not, consider renal impairment with CKD and or repeat pth and clarify if pth vs [facility]  Ordering 24 hour urine creatinine as well to r/o [facility].  If unsure will refer to endo vs nephrology

## 2023-03-05 ENCOUNTER — Other Ambulatory Visit: Payer: Self-pay | Admitting: Family

## 2023-03-05 ENCOUNTER — Other Ambulatory Visit: Payer: Self-pay

## 2023-03-05 DIAGNOSIS — R7989 Other specified abnormal findings of blood chemistry: Secondary | ICD-10-CM

## 2023-03-05 DIAGNOSIS — E559 Vitamin D deficiency, unspecified: Secondary | ICD-10-CM

## 2023-03-05 DIAGNOSIS — R748 Abnormal levels of other serum enzymes: Secondary | ICD-10-CM

## 2023-03-05 MED ORDER — CHOLECALCIFEROL 1.25 MG (50000 UT) PO TABS
1.0000 | ORAL_TABLET | ORAL | 0 refills | Status: DC
Start: 2023-03-05 — End: 2024-05-21

## 2023-03-05 NOTE — Progress Notes (Signed)
Vitamin d is incredibly low. This can sometimes increase PTH so let's replenish the vitamin D. I will also send you to endo for full evaluation of the parathyroid. A referral will Please let us know if you have not heard back within 2 weeks about the referral.   I will send an RX for vitamin D3 50,000 IU which you will take once weekly for 8 weeks. Once RX is complete, please continue over the counter Vitamin D3 2000 IU once daily.

## 2023-03-06 LAB — CREATININE, URINE, 24 HOUR: Creatinine, 24H Ur: 0.85 g/(24.h) (ref 0.50–2.15)

## 2023-03-06 NOTE — Progress Notes (Signed)
Creatinine normal limits. This is reassuring.

## 2023-03-07 ENCOUNTER — Telehealth: Payer: Self-pay | Admitting: Family

## 2023-03-07 NOTE — Telephone Encounter (Signed)
I called patient to discuss her Radiology order for Body Scan.   She is requesting a call back with her Urine results. States that she dropped off her urine sample on Wednesday 03/05/23.   Please advise, thanks.

## 2023-03-10 NOTE — Telephone Encounter (Signed)
Urine test was negative.

## 2023-03-10 NOTE — Telephone Encounter (Signed)
Called patient reviewed all information and repeated back to me. Will call if any questions.  ? ?

## 2023-03-11 ENCOUNTER — Ambulatory Visit: Payer: Medicare Other | Admitting: Family

## 2023-03-12 ENCOUNTER — Other Ambulatory Visit: Payer: Self-pay | Admitting: Family

## 2023-03-14 ENCOUNTER — Telehealth: Payer: Self-pay | Admitting: Family

## 2023-03-14 NOTE — Telephone Encounter (Signed)
Pt called to let Dugal know that she was told by one of the referral coordinators that the pt needed to see endocrinology first before having bone scan done, due to insurance not covering the scan. Pt states she has a appt with endocrinology on 7/29. Call back # [phone]

## 2023-03-17 NOTE — Telephone Encounter (Signed)
Yes noted.  This was per my recommendation. Will see endo first.

## 2023-03-21 ENCOUNTER — Telehealth: Payer: Self-pay | Admitting: Physician Assistant

## 2023-03-21 NOTE — Telephone Encounter (Signed)
Patient return the call back. She stated that you advised her to give you a call back and she was worried because she didn't know if it was good or bad.

## 2023-04-08 ENCOUNTER — Ambulatory Visit: Payer: Medicare Other | Admitting: Physician Assistant

## 2023-04-09 ENCOUNTER — Other Ambulatory Visit: Payer: Medicare Other

## 2023-04-26 ENCOUNTER — Other Ambulatory Visit: Payer: Self-pay | Admitting: Family

## 2023-04-26 DIAGNOSIS — F419 Anxiety disorder, unspecified: Secondary | ICD-10-CM

## 2023-04-27 ENCOUNTER — Other Ambulatory Visit: Payer: Self-pay | Admitting: Family

## 2023-04-27 DIAGNOSIS — E559 Vitamin D deficiency, unspecified: Secondary | ICD-10-CM

## 2023-04-28 ENCOUNTER — Other Ambulatory Visit: Payer: Self-pay

## 2023-04-29 NOTE — Progress Notes (Unsigned)
Celso Amy, PA-C [address]  [address]  [place], [place] [address]  Main: [phone]  Fax: [phone]   Primary Care Physician: Mort Sawyers, FNP  Primary Gastroenterologist:  Celso Amy, PA-C / Dr. Wyline Mood    CC: F/U Elevated alkaline phosphatase  HPI: Alexandria Mason is a 72 y.o. female returns for 79-month follow-up of elevated bilirubin and alk phos.  Labs 02/25/2023 showed Normal GGT and elevated PTH concerning for hyperparathyroidism.  No liver source for elevated alk phos.  She was told to follow-up with PCP.  All other labs normal.  Hepatitis A/B/C Negative.  She was not immune to hepatitis A or B and was scheduled for Twinrix vaccine.  Normal iron, TSH, and alkaline phosphatase isoenzymes.  ANA, AMA, and ASMA were negative/normal.  Patient saw endocrinologist Dr. Ronald Lobo to evaluate elevated alk phos and PTH 04/14/2023.  She had severe vitamin D deficiency (10) and was started on vitamin D by her PCP.  It was thought that she had secondary hyperparathyroidism due to severe vitamin D deficiency.  Elevated alkaline phosphatase is a common consequence.  Endocrinologist felt that after her vitamin D was aggressively replaced, all of her other lab abnormalities should improve.  She was scheduled for follow-up with endocrinologist in 3 months.  Repeat labs in 2 months.  Continued on vitamin D 50,000 units for 2 months.  Labs 01/30/2023 showed elevated alkaline phosphatase 144.  Normal total bilirubin 1.5, AST 17, ALT 10. Labs 10/2021 showed elevated alkaline phosphatase 191, total bilirubin 1.5.  Normal ferritin and TSH. Labs 05/2021 showed elevated alkaline phosphatase 154, total bilirubin 1.6. HIV negative in 2021.  HCV negative in 2020.   Complete abdominal ultrasound 10/2021 showed normal gallbladder with no gallstones.  CBD 3.2 mm, normal.  Liver was normal.  No liver lesions.  Normal echogenicity.  Limited evaluation of pancreas, otherwise normal  ultrasound.   Denies alcohol or tobacco use.  No family history of liver disease.  She denies any GI symptoms.  No previous GI evaluation or colonoscopy.  No family history of colon cancer.   Current Outpatient Medications  Medication Sig Dispense Refill   atorvastatin (LIPITOR) 20 MG tablet Take 1 tablet (20 mg total) by mouth daily. 90 tablet 3   Cholecalciferol 1.25 MG (50000 UT) TABS Take 1 tablet by mouth once a week. 8 tablet 0   meclizine (ANTIVERT) 12.5 MG tablet Take 1 tablet (12.5 mg total) by mouth 3 (three) times daily as needed for dizziness. 30 tablet 0   Naproxen Sodium 220 MG CAPS Take by mouth daily as needed.     propranolol (INDERAL) 10 MG tablet TAKE 1 TABLET BY MOUTH DAILY AS NEEDED 1 HOUR BEFORE ANXIOUS EVENT 30 tablet 0   Vitamin D, Ergocalciferol, (DRISDOL) 1.25 MG (50000 UNIT) CAPS capsule Take 50,000 Units by mouth every 30 (thirty) days.     No current facility-administered medications for this visit.    Allergies as of 04/30/2023 - Review Complete 04/30/2023  Allergen Reaction Noted   Oxycodone-acetaminophen Other (See Comments) 04/10/2015   Sulfa antibiotics  04/10/2015    Past Medical History:  Diagnosis Date   Acute hypoxemic respiratory failure due to COVID-19 [facility]) 08/18/2020   Gastroenteritis due to COVID-19 virus 08/18/2020    Past Surgical History:  Procedure Laterality Date   BUNIONECTOMY     x 2-bilateral feet- different years for each one   CATARACT EXTRACTION Left 2011   oral surgeries     WRIST FRACTURE  SURGERY Right 08/2006   metal plate present    Review of Systems:    All systems reviewed and negative except where noted in HPI.   Physical Examination:   BP 107/63   Pulse 80   Temp 98.2 F (36.8 C)   Ht 5' 2.5" (1.588 m)   Wt 169 lb (76.7 kg)   BMI 30.42 kg/m   General: Well-nourished, well-developed in no acute distress.  Psych: Alert and cooperative, normal mood and affect. No Exam Performed.  Imaging Studies: No  results found.  Assessment and Plan:   Alexandria Mason is a 72 y.o. y/o female presents for follow-up of elevated alkaline phosphatase.  All liver lab work and RUQ ultrasound was normal.  GGT normal.  No liver source.  She followed up with endocrinologist and elevated alk phos was thought secondary to severe vitamin D deficiency which caused secondary hyperparathyroidism.  She is currently on vitamin D supplement and has follow-up with endocrinologist in a few months to repeat lab work.    1.  Elevated alkaline phosphatase  Liver evaluation unremarkable.  No further GI workup needed.   2.  Severe vitamin D deficiency  Continue vitamin D supplement and follow-up with PCP and endocrinologist.  3.  Secondary hyperparathyroidism  4.  Immunization counseling  She was given Twinrix hepatitis A and B vaccine today.  Recent labs showed no immunity.  5.  Colon cancer screening  We discussed colon cancer screening guidelines and options.  I offered patient screening Cologuard versus traditional colonoscopy.  Patient declined both tests.  She will let us know if she changes her mind in the future.  Also follow-up if she develops any GI symptoms in the future.  Celso Amy, PA-C  Follow up as needed if she has any GI symptoms.

## 2023-04-30 ENCOUNTER — Encounter: Payer: Self-pay | Admitting: Physician Assistant

## 2023-04-30 ENCOUNTER — Ambulatory Visit: Payer: Medicare Other | Admitting: Physician Assistant

## 2023-04-30 VITALS — BP 107/63 | HR 80 | Temp 98.2°F | Ht 62.5 in | Wt 169.0 lb

## 2023-04-30 DIAGNOSIS — R748 Abnormal levels of other serum enzymes: Secondary | ICD-10-CM | POA: Diagnosis not present

## 2023-04-30 DIAGNOSIS — Z7185 Encounter for immunization safety counseling: Secondary | ICD-10-CM | POA: Diagnosis not present

## 2023-04-30 DIAGNOSIS — Z1211 Encounter for screening for malignant neoplasm of colon: Secondary | ICD-10-CM

## 2023-04-30 DIAGNOSIS — Z23 Encounter for immunization: Secondary | ICD-10-CM | POA: Diagnosis not present

## 2023-04-30 DIAGNOSIS — E559 Vitamin D deficiency, unspecified: Secondary | ICD-10-CM | POA: Diagnosis not present

## 2023-04-30 NOTE — Patient Instructions (Addendum)
It was good to see you today!  All of the workup we have done to evaluate your liver has been good.  Nothing worrisome.  Your elevated alkaline phosphatase test is most likely secondary to severe vitamin D deficiency and secondary hyperparathyroidism.  This should resolve after you have been on vitamin D supplement.  I recommend you continue to follow-up with the endocrinologist as scheduled.  No further GI testing or evaluation is needed.  We are happy to see you back in the future if you have any GI symptoms.  We have provided you with Twinrix hepatitis A and B vaccine today to help protect you from hepatitis A and B in the future.

## 2023-05-18 ENCOUNTER — Other Ambulatory Visit: Payer: Self-pay | Admitting: Family

## 2023-05-18 DIAGNOSIS — F419 Anxiety disorder, unspecified: Secondary | ICD-10-CM

## 2023-05-21 ENCOUNTER — Ambulatory Visit (INDEPENDENT_AMBULATORY_CARE_PROVIDER_SITE_OTHER): Payer: Medicare Other

## 2023-05-21 DIAGNOSIS — Z0000 Encounter for general adult medical examination without abnormal findings: Secondary | ICD-10-CM

## 2023-05-21 NOTE — Patient Instructions (Signed)
Alexandria Mason , Thank you for taking time to come for your Medicare Wellness Visit. I appreciate your ongoing commitment to your health goals. Please review the following plan we discussed and let me know if I can assist you in the future.   Referrals/Orders/Follow-Ups/Clinician Recommendations: none  This is a list of the screening recommended for you and due dates:  Health Maintenance  Topic Date Due   Zoster (Shingles) Vaccine (1 of 2) Never done   Colon Cancer Screening  11/26/2023*   Flu Shot  12/15/2023*   Mammogram  01/30/2024*   DEXA scan (bone density measurement)  01/30/2024*   Medicare Annual Wellness Visit  05/20/2024   DTaP/Tdap/Td vaccine (2 - Td or Tdap) 10/23/2028   Pneumonia Vaccine  Completed   Hepatitis C Screening  Completed   HPV Vaccine  Aged Out   COVID-19 Vaccine  Discontinued  *Topic was postponed. The date shown is not the original due date.    Advanced directives: (ACP Link)Information on Advanced Care Planning can be found at [facility] [place] Advance Health Care Directives Advance Health Care Directives ([email or web address]   Next Medicare Annual Wellness Visit scheduled for next year: Yes  Insert Preventive Care attachment Insert FALL PREVENTION attachment if needed

## 2023-05-21 NOTE — Progress Notes (Signed)
Subjective:   Breeze Bissett is a 72 y.o. female who presents for Medicare Annual (Subsequent) preventive examination.  Visit Complete: Virtual  I connected with  [name] on 05/21/23 by a audio enabled telemedicine application and verified that I am speaking with the correct person using two identifiers.  Patient Location: Home  Provider Location: Office/Clinic  I discussed the limitations of evaluation and management by telemedicine. The patient expressed understanding and agreed to proceed.  Vital Signs: Unable to obtain new vitals due to this being a telehealth visit.  Review of Systems     Cardiac Risk Factors include: advanced age (>61men, >50 women);dyslipidemia     Objective:    Today's Vitals   There is no height or weight on file to calculate BMI.     05/21/2023    2:31 PM 12/12/2021    9:34 AM 08/18/2020    4:52 AM 08/16/2020   10:22 PM 11/26/2019    2:49 PM  Advanced Directives  Does Patient Have a Medical Advance Directive? No No No No No  Would patient like information on creating a medical advance directive?  No - Patient declined No - Patient declined  No - Patient declined    Current Medications (verified) Outpatient Encounter Medications as of 05/21/2023  Medication Sig   atorvastatin (LIPITOR) 20 MG tablet Take 1 tablet (20 mg total) by mouth daily.   Cholecalciferol 1.25 MG (50000 UT) TABS Take 1 tablet by mouth once a week.   meclizine (ANTIVERT) 12.5 MG tablet Take 1 tablet (12.5 mg total) by mouth 3 (three) times daily as needed for dizziness.   Naproxen Sodium 220 MG CAPS Take by mouth daily as needed.   propranolol (INDERAL) 10 MG tablet TAKE 1 TABLET BY MOUTH DAILY AS NEEDED 1 HOUR BEFORE ANXIOUS EVENT   Vitamin D, Ergocalciferol, (DRISDOL) 1.25 MG (50000 UNIT) CAPS capsule Take 50,000 Units by mouth every 30 (thirty) days.   No facility-administered encounter medications on file as of 05/21/2023.    Allergies  (verified) Oxycodone-acetaminophen and Sulfa antibiotics   History: Past Medical History:  Diagnosis Date   Acute hypoxemic respiratory failure due to COVID-19 (HCC) 08/18/2020   Gastroenteritis due to COVID-19 virus 08/18/2020   Past Surgical History:  Procedure Laterality Date   BUNIONECTOMY     x 2-bilateral feet- different years for each one   CATARACT EXTRACTION Left 2011   oral surgeries     WRIST FRACTURE SURGERY Right 08/2006   metal plate present   Family History  Problem Relation Age of Onset   Congestive Heart Failure Mother    Heart disease Father    Heart attack Father 36   Heart disease Brother    Heart attack Paternal Grandfather    Social History   Socioeconomic History   Marital status: Divorced    Spouse name: Not on file   Number of children: 1   Years of education: some college   Highest education level: Not on file  Occupational History    Employer: RETIRED  Tobacco Use   Smoking status: Never   Smokeless tobacco: Never  Vaping Use   Vaping status: Never Used  Substance and Sexual Activity   Alcohol use: Not Currently    Comment: rarely   Drug use: Never   Sexual activity: Yes    Partners: Male  Other Topics Concern   Not on file  Social History Narrative   Living on social security, retired from [employer] alone  One daughter - Efraim Kaufmann    Enjoys: going to the gym, walking with her neighbor   Exercise: gym 3 times a week   Social support - yes, good friends, daughter   Diet: pretty healthy - salads, meats, chicken   Social Determinants of Health   Financial Resource Strain: Low Risk  (05/21/2023)   Overall Financial Resource Strain (CARDIA)    Difficulty of Paying Living Expenses: Not hard at all  Food Insecurity: No Food Insecurity (05/21/2023)   Hunger Vital Sign    Worried About Running Out of Food in the Last Year: Never true    Ran Out of Food in the Last Year: Never true  Transportation Needs: No Transportation Needs  (05/21/2023)   PRAPARE - Administrator, Civil Service (Medical): No    Lack of Transportation (Non-Medical): No  Physical Activity: Sufficiently Active (05/21/2023)   Exercise Vital Sign    Days of Exercise per Week: 7 days    Minutes of Exercise per Session: 30 min  Stress: No Stress Concern Present (05/21/2023)   [employer] of Occupational Health - Occupational Stress Questionnaire    Feeling of Stress : Not at all  Social Connections: Moderately Integrated (05/21/2023)   Social Connection and Isolation Panel [NHANES]    Frequency of Communication with Friends and Family: More than three times a week    Frequency of Social Gatherings with Friends and Family: More than three times a week    Attends Religious Services: More than 4 times per year    Active Member of [employer] or Organizations: Yes    Attends Engineer, structural: More than 4 times per year    Marital Status: Divorced    Tobacco Counseling Counseling given: Not Answered   Clinical Intake:  Pre-visit preparation completed: Yes  Pain : No/denies pain     Nutritional Risks: None Diabetes: No  How often do you need to have someone help you when you read instructions, pamphlets, or other written materials from your doctor or pharmacy?: 1 - Never  Interpreter Needed?: No  Information entered by :: NAllen LPN   Activities of Daily Living    05/21/2023    2:27 PM  In your present state of health, do you have any difficulty performing the following activities:  Hearing? 0  Vision? 1  Comment legally blind in right eye  Difficulty concentrating or making decisions? 0  Walking or climbing stairs? 0  Dressing or bathing? 0  Doing errands, shopping? 0  Preparing Food and eating ? N  Using the Toilet? N  In the past six months, have you accidently leaked urine? N  Do you have problems with loss of bowel control? N  Managing your Medications? N  Managing your Finances? N  Housekeeping or  managing your Housekeeping? N    Patient Care Team: Mort Sawyers, FNP as PCP - General (Family Medicine)  Indicate any recent Medical Services you may have received from other than [facility] providers in the past year (date may be approximate).     Assessment:   This is a routine wellness examination for [place].  Hearing/Vision screen Hearing Screening - Comments:: Denies hearing issues Vision Screening - Comments:: No regular eye exams  Dietary issues and exercise activities discussed:     Goals Addressed             This Visit's Progress    Patient Stated       05/21/2023, wants to lose weight  Depression Screen    05/21/2023    2:33 PM 03/04/2023   12:30 PM 01/30/2023   11:27 AM 12/12/2021    9:40 AM 11/26/2019    2:52 PM 10/20/2018   12:33 PM  PHQ 2/9 Scores  PHQ - 2 Score 0 0 0 0 0 0  PHQ- 9 Score 0 0 2  0 0    Fall Risk    05/21/2023    2:32 PM 03/04/2023   12:30 PM 03/04/2023   10:43 AM 01/30/2023   11:27 AM 12/12/2021    9:38 AM  Fall Risk   Falls in the past year? 0 0 0 0 0  Number falls in past yr: 0 0 0 0 0  Injury with Fall? 0 0 0 0 0  Risk for fall due to : Medication side effect No Fall Risks No Fall Risks    Follow up Falls prevention discussed;Falls evaluation completed Falls evaluation completed Falls evaluation completed Falls evaluation completed;Education provided;Falls prevention discussed Education provided;Falls prevention discussed;Falls evaluation completed    MEDICARE RISK AT HOME: Medicare Risk at Home Any stairs in or around the home?: Yes If so, are there any without handrails?: No Home free of loose throw rugs in walkways, pet beds, electrical cords, etc?: Yes Adequate lighting in your home to reduce risk of falls?: Yes Life alert?: No Use of a cane, walker or w/c?: No Grab bars in the bathroom?: No Shower chair or bench in shower?: No Elevated toilet seat or a handicapped toilet?: Yes  TIMED UP AND GO:  Was the test  performed?  No    Cognitive Function:    11/26/2019    2:54 PM  MMSE - Mini Mental State Exam  Orientation to time 5  Orientation to Place 5  Registration 3  Attention/ Calculation 5  Recall 3  Language- repeat 1        05/21/2023    2:33 PM 12/25/2021   12:02 PM  6CIT Screen  What Year? 0 points 0 points  What month? 0 points 0 points  What time? 0 points 0 points  Count back from 20 0 points 0 points  Months in reverse 0 points 0 points  Repeat phrase 0 points 0 points  Total Score 0 points 0 points    Immunizations Immunization History  Administered Date(s) Administered   Hep A / Hep B 04/30/2023   Pneumococcal Conjugate-13 10/20/2018   Pneumococcal Polysaccharide-23 11/30/2019   Tdap 10/23/2018    TDAP status: Up to date  Flu Vaccine status: Declined, Education has been provided regarding the importance of this vaccine but patient still declined. Advised may receive this vaccine at local pharmacy or Health Dept. Aware to provide a copy of the vaccination record if obtained from local pharmacy or Health Dept. Verbalized acceptance and understanding.  Pneumococcal vaccine status: Up to date  Covid-19 vaccine status: Completed vaccines  Qualifies for Shingles Vaccine? Yes   Zostavax completed No   Shingrix Completed?: No.    Education has been provided regarding the importance of this vaccine. Patient has been advised to call insurance company to determine out of pocket expense if they have not yet received this vaccine. Advised may also receive vaccine at local pharmacy or Health Dept. Verbalized acceptance and understanding.  Screening Tests Health Maintenance  Topic Date Due   Zoster Vaccines- Shingrix (1 of 2) Never done   Colonoscopy  11/26/2023 (Originally 11/05/1994)   INFLUENZA VACCINE  12/15/2023 (Originally 04/17/2023)   MAMMOGRAM  01/30/2024 (Originally 11/06/1999)   DEXA SCAN  01/30/2024 (Originally 11/05/2014)   Medicare Annual Wellness (AWV)  05/20/2024    DTaP/Tdap/Td (2 - Td or Tdap) 10/23/2028   Pneumonia Vaccine 74+ Years old  Completed   Hepatitis C Screening  Completed   HPV VACCINES  Aged Out   COVID-19 Vaccine  Discontinued    Health Maintenance  Health Maintenance Due  Topic Date Due   Zoster Vaccines- Shingrix (1 of 2) Never done    Colorectal cancer screening: declines  Mammogram status: decline  Bone Density status: declines  Lung Cancer Screening: (Low Dose CT Chest recommended if Age 50-80 years, 20 pack-year currently smoking OR have quit w/in 15years.) does not qualify.   Lung Cancer Screening Referral: no  Additional Screening:  Hepatitis C Screening: does qualify; Completed 02/25/2023  Vision Screening: Recommended annual ophthalmology exams for early detection of glaucoma and other disorders of the eye. Is the patient up to date with their annual eye exam?  No  Who is the provider or what is the name of the office in which the patient attends annual eye exams? none If pt is not established with a provider, would they like to be referred to a provider to establish care? No .   Dental Screening: Recommended annual dental exams for proper oral hygiene  Diabetic Foot Exam: n/a  Community Resource Referral / Chronic Care Management: CRR required this visit?  No   CCM required this visit?  No     Plan:     I have personally reviewed and noted the following in the patient's chart:   Medical and social history Use of alcohol, tobacco or illicit drugs  Current medications and supplements including opioid prescriptions. Patient is not currently taking opioid prescriptions. Functional ability and status Nutritional status Physical activity Advanced directives List of other physicians Hospitalizations, surgeries, and ER visits in previous 12 months Vitals Screenings to include cognitive, depression, and falls Referrals and appointments  In addition, I have reviewed and discussed with patient  certain preventive protocols, quality metrics, and best practice recommendations. A written personalized care plan for preventive services as well as general preventive health recommendations were provided to patient.     Barb Merino, LPN   09/24/1476   After Visit Summary: (Pick Up) Due to this being a telephonic visit, with patients personalized plan was offered to patient and patient has requested to Pick up at office.  Nurse Notes: none

## 2023-06-17 ENCOUNTER — Other Ambulatory Visit: Payer: Self-pay | Admitting: Family

## 2023-06-17 DIAGNOSIS — F419 Anxiety disorder, unspecified: Secondary | ICD-10-CM

## 2023-07-19 ENCOUNTER — Other Ambulatory Visit: Payer: Self-pay | Admitting: Family

## 2023-07-19 DIAGNOSIS — F419 Anxiety disorder, unspecified: Secondary | ICD-10-CM

## 2023-08-15 ENCOUNTER — Other Ambulatory Visit: Payer: Self-pay | Admitting: Family

## 2023-08-15 DIAGNOSIS — F419 Anxiety disorder, unspecified: Secondary | ICD-10-CM

## 2023-08-18 NOTE — Telephone Encounter (Signed)
LM for pt to returncall

## 2023-08-18 NOTE — Telephone Encounter (Signed)
Is pt having to take propanolol daily?  Or is pharmacy just requesting this before she actually needs it?

## 2023-08-19 NOTE — Telephone Encounter (Signed)
Spoke with pt and she states that she did not request this refill and does not need any medication at this time. Refill request will be denied at this time.

## 2023-09-04 ENCOUNTER — Other Ambulatory Visit: Payer: Self-pay | Admitting: Family

## 2023-09-04 DIAGNOSIS — E785 Hyperlipidemia, unspecified: Secondary | ICD-10-CM

## 2024-02-19 ENCOUNTER — Other Ambulatory Visit: Payer: Self-pay | Admitting: Family

## 2024-02-19 DIAGNOSIS — E785 Hyperlipidemia, unspecified: Secondary | ICD-10-CM

## 2024-02-19 NOTE — Telephone Encounter (Signed)
 Spoke to pt, pt states she has a lot of appointments going on right now, won't be able to visit within the next 30 days. Pt stated she has a cpe sch in sept, she hopes that will be fine for her refills.

## 2024-04-03 ENCOUNTER — Other Ambulatory Visit: Payer: Self-pay | Admitting: Family

## 2024-04-03 DIAGNOSIS — E785 Hyperlipidemia, unspecified: Secondary | ICD-10-CM

## 2024-04-13 ENCOUNTER — Other Ambulatory Visit: Payer: Self-pay | Admitting: Family

## 2024-04-13 DIAGNOSIS — E785 Hyperlipidemia, unspecified: Secondary | ICD-10-CM

## 2024-05-21 ENCOUNTER — Ambulatory Visit (INDEPENDENT_AMBULATORY_CARE_PROVIDER_SITE_OTHER): Admitting: Family

## 2024-05-21 ENCOUNTER — Encounter: Payer: Self-pay | Admitting: Family

## 2024-05-21 VITALS — BP 112/74 | HR 55 | Temp 98.4°F | Ht 62.0 in | Wt 170.4 lb

## 2024-05-21 DIAGNOSIS — R944 Abnormal results of kidney function studies: Secondary | ICD-10-CM | POA: Diagnosis not present

## 2024-05-21 DIAGNOSIS — E049 Nontoxic goiter, unspecified: Secondary | ICD-10-CM

## 2024-05-21 DIAGNOSIS — E559 Vitamin D deficiency, unspecified: Secondary | ICD-10-CM

## 2024-05-21 DIAGNOSIS — E211 Secondary hyperparathyroidism, not elsewhere classified: Secondary | ICD-10-CM

## 2024-05-21 DIAGNOSIS — Z0000 Encounter for general adult medical examination without abnormal findings: Secondary | ICD-10-CM

## 2024-05-21 DIAGNOSIS — E782 Mixed hyperlipidemia: Secondary | ICD-10-CM | POA: Diagnosis not present

## 2024-05-21 DIAGNOSIS — R7989 Other specified abnormal findings of blood chemistry: Secondary | ICD-10-CM

## 2024-05-21 DIAGNOSIS — Z780 Asymptomatic menopausal state: Secondary | ICD-10-CM

## 2024-05-21 DIAGNOSIS — R748 Abnormal levels of other serum enzymes: Secondary | ICD-10-CM

## 2024-05-21 DIAGNOSIS — D75839 Thrombocytosis, unspecified: Secondary | ICD-10-CM

## 2024-05-21 DIAGNOSIS — Z972 Presence of dental prosthetic device (complete) (partial): Secondary | ICD-10-CM | POA: Diagnosis not present

## 2024-05-21 LAB — CBC
HCT: 42.9 % (ref 36.0–46.0)
Hemoglobin: 14 g/dL (ref 12.0–15.0)
MCHC: 32.7 g/dL (ref 30.0–36.0)
MCV: 91.7 fl (ref 78.0–100.0)
Platelets: 193 K/uL (ref 150.0–400.0)
RBC: 4.67 Mil/uL (ref 3.87–5.11)
RDW: 12.9 % (ref 11.5–15.5)
WBC: 5.7 K/uL (ref 4.0–10.5)

## 2024-05-21 LAB — LIPID PANEL
Cholesterol: 143 mg/dL (ref 0–200)
HDL: 53.3 mg/dL (ref >39.00)
LDL Cholesterol: 69 mg/dL (ref 0–99)
NonHDL: 89.93
Total CHOL/HDL Ratio: 3
Triglycerides: 107 mg/dL (ref 0.0–149.0)
VLDL: 21.4 mg/dL (ref 0.0–40.0)

## 2024-05-21 LAB — T3, FREE: T3, Free: 3.7 pg/mL (ref 2.3–4.2)

## 2024-05-21 LAB — COMPREHENSIVE METABOLIC PANEL WITH GFR
ALT: 13 U/L (ref 0–35)
AST: 19 U/L (ref 0–37)
Albumin: 4.3 g/dL (ref 3.5–5.2)
Alkaline Phosphatase: 147 U/L — ABNORMAL HIGH (ref 39–117)
BUN: 13 mg/dL (ref 6–23)
CO2: 30 meq/L (ref 19–32)
Calcium: 9.5 mg/dL (ref 8.4–10.5)
Chloride: 103 meq/L (ref 96–112)
Creatinine, Ser: 0.93 mg/dL (ref 0.40–1.20)
GFR: 60.53 mL/min (ref >60.00)
Glucose, Bld: 94 mg/dL (ref 70–99)
Potassium: 4.2 meq/L (ref 3.5–5.1)
Sodium: 142 meq/L (ref 135–145)
Total Bilirubin: 1.4 mg/dL — ABNORMAL HIGH (ref 0.2–1.2)
Total Protein: 6.5 g/dL (ref 6.0–8.3)

## 2024-05-21 LAB — T4, FREE: Free T4: 0.68 ng/dL (ref 0.60–1.60)

## 2024-05-21 LAB — VITAMIN D 25 HYDROXY (VIT D DEFICIENCY, FRACTURES): VITD: 37.13 ng/mL (ref 30.00–100.00)

## 2024-05-21 LAB — TSH: TSH: 2.57 u[IU]/mL (ref 0.35–5.50)

## 2024-05-21 NOTE — Progress Notes (Signed)
 Subjective:  Patient ID: Alexandria Mason, female    DOB: 04/29/1951  Age: 73 y.o. MRN: [id number]  Patient Care Team: Corwin Antu, FNP as PCP - General (Family Medicine)   CC:  Chief Complaint  Patient presents with   Annual Exam    HPI Alexandria Mason is a 73 y.o. female who presents today for an annual physical exam. She reports consuming a general diet. Walks during the week and goes to the gym  She generally feels well. She reports sleeping fairly well. She does not have additional problems to discuss today.   Vision:Within last year Dental:No regular dental care Wears dentures   Mammogram: declines Last pap: > 58 y/o  Colonoscopy: declines  Bone density scan:   Pt is without acute concerns.   Discussed the use of AI scribe software for clinical note transcription with the patient, who gave verbal consent to proceed.  History of Present Illness Alexandria Mason is a 73 year old female who presents for an annual physical exam.  She experiences mild sinus issues, particularly when exposed to ragweed, without feeling sick. She has had hoarseness since the fall season began, accompanied by drainage and throat discomfort. She experienced headaches on two separate days, which were not severe and resolved with rest. Her eyes watered during these episodes, likely due to allergies. She takes Allegra and uses Cylate for allergy relief as needed.  She is currently taking vitamin D3 at a dose of 2000 IU daily. She was previously diagnosed with vitamin D  deficiency, which was addressed with supplementation, normalizing her vitamin D  levels. She recalls a previous evaluation by an endocrinologist for elevated parathyroid  hormone levels, suspected to be secondary to vitamin D  deficiency. Lab work in June of the previous year showed elevated parathyroid  hormone and low vitamin D  levels. An abdominal ultrasound in February 2023 showed normal liver, gallbladder, and bile ducts, but the  pancreas was poorly visualized. Her alkaline phosphatase levels were elevated for over a year, with normal GGT levels.  She engages in daily physical activity by walking with her 43 year old neighbor. She has recently reduced her yard work due to concerns about heat exposure and potential health risks. She has a supportive relationship with her neighbors, who assist with tasks like tree trimming.  She denies digestive issues, pain, or dizziness upon sitting up.   Advanced Directives Patient does have advanced directives. She does not have a copy in the electronic medical record.   DEPRESSION SCREENING    05/21/2024    9:31 AM 05/21/2023    2:33 PM 03/04/2023   12:30 PM 01/30/2023   11:27 AM 12/12/2021    9:40 AM 11/26/2019    2:52 PM 10/20/2018   12:33 PM  PHQ 2/9 Scores  PHQ - 2 Score 0 0 0 0 0 0 0  PHQ- 9 Score 0 0 0 2  0 0     ROS: Negative unless specifically indicated above in HPI.    Current Outpatient Medications:    atorvastatin  (LIPITOR) 20 MG tablet, TAKE 1 TABLET BY MOUTH EVERY DAY, Disp: 90 tablet, Rfl: 0   cholecalciferol  (VITAMIN D3) 25 MCG (1000 UNIT) tablet, Take 2,000 Units by mouth daily., Disp: , Rfl:    propranolol  (INDERAL ) 10 MG tablet, TAKE 1 TABLET BY MOUTH DAILY AS NEEDED 1 HOUR BEFORE ANXIOUS EVENT, Disp: 30 tablet, Rfl: 0   meclizine  (ANTIVERT ) 12.5 MG tablet, Take 1 tablet (12.5 mg total) by mouth 3 (three) times daily as needed for dizziness., Disp:  30 tablet, Rfl: 0   Naproxen Sodium 220 MG CAPS, Take by mouth daily as needed., Disp: , Rfl:     Objective:    BP 112/74 (BP Location: Left Arm, Patient Position: Sitting, Cuff Size: Normal)   Pulse (!) 55   Temp 98.4 F (36.9 C) (Oral)   Ht 5' 2 (1.575 m)   Wt 170 lb 6.4 oz (77.3 kg)   SpO2 98%   BMI 31.17 kg/m   BP Readings from Last 3 Encounters:  05/21/24 112/74  04/30/23 107/63  03/04/23 116/62      Physical Exam Vitals reviewed.  Constitutional:      General: She is not in acute  distress.    Appearance: Normal appearance. She is normal weight. She is not ill-appearing.  HENT:     Head: Normocephalic.     Right Ear: Tympanic membrane normal.     Left Ear: Tympanic membrane normal.     Nose: Nose normal.     Mouth/Throat:     Mouth: Mucous membranes are moist.  Eyes:     Extraocular Movements: Extraocular movements intact.     Pupils: Pupils are equal, round, and reactive to light.  Neck:     Thyroid : Thyromegaly (slight) present.  Cardiovascular:     Rate and Rhythm: Normal rate and regular rhythm.  Pulmonary:     Effort: Pulmonary effort is normal.     Breath sounds: Normal breath sounds.  Abdominal:     General: Abdomen is flat. Bowel sounds are normal.     Palpations: Abdomen is soft.     Tenderness: There is no guarding or rebound.  Musculoskeletal:        General: Normal range of motion.     Cervical back: Normal range of motion.  Skin:    General: Skin is warm.     Capillary Refill: Capillary refill takes less than 2 seconds.  Neurological:     General: No focal deficit present.     Mental Status: She is alert.  Psychiatric:        Mood and Affect: Mood normal.        Behavior: Behavior normal.        Thought Content: Thought content normal.        Judgment: Judgment normal.       Results LABS Parathyroid  Hormone: Elevated (02/2023) Vitamin D : Severely low (02/2023) Alkaline Phosphatase: Elevated (02/2023) GGT: Normal (02/2023) Smooth Muscle Antibody: Normal (02/2023) Antimitochondrial Antibody: Normal (02/2023) Fractionated Alkaline Phosphatase: Liver fraction 66%, Bone fraction 27%, Intestinal fraction 7% (02/2023) Vitamin D : 68.3 (07/2023)  RADIOLOGY Abdominal Ultrasound: Normal liver, gallbladder, and bile ducts. Pancreas poorly visualized. (10/2021)      Assessment & Plan:   Assessment and Plan Assessment & Plan Adult Wellness Visit Routine wellness visit with no acute issues. She is active, walking daily, and mindful  of health limits, especially in hot weather. No pre-diabetes or significant health concerns. - Encourage continued physical activity and healthy lifestyle choices. - Discuss the importance of knowing personal limits, especially in hot weather. -Patient Counseling(The following topics were reviewed):  Preventative care handout given to pt  Health maintenance and immunizations reviewed. Please refer to Health maintenance section. Pt advised on safe sex, wearing seatbelts in car, and proper nutrition labwork ordered today for annual Dental health: Discussed importance of regular tooth brushing, flossing, and dental visits.  Allergic rhinitis (seasonal) Mild seasonal allergic rhinitis with symptoms of hoarseness, drainage, and headaches. Symptoms are managed with Allegra as  needed. - Continue Allegra as needed for allergy symptoms.  Vitamin D  deficiency with secondary hyperparathyroidism (resolved) Previously diagnosed with vitamin D  deficiency leading to secondary hyperparathyroidism. Vitamin D  levels have normalized with supplementation, and secondary hyperparathyroidism is suspected to be resolved. - Order repeat vitamin D  and parathyroid  hormone levels.  General Health Maintenance Discussed shingles vaccine, noting potential temporary discomfort but reduced severity of shingles outbreaks. Plans to receive shingles and flu vaccines next month. - Encourage scheduling of shingles and flu vaccines. - Provide information on potential side effects of the shingles vaccine.  Recording duration: 15 minutes        Follow-up: Return in about 1 year (around 05/21/2025) for f/u CPE.   Ginger Patrick, FNP

## 2024-05-21 NOTE — Patient Instructions (Signed)
 I have sent an electronic order over to your preferred location for the following:   []   2D Mammogram  []   3D Mammogram  [x]   Bone Density   Please give this center a call to get scheduled at your convenience.  [x]   [facility]  [address] [place] [address]  [phone]  Make sure to wear two piece  clothing  No lotions powders or deodorants the day of the appointment Make sure to bring picture ID and insurance card.  Bring list of medications you are currently taking including any supplements.

## 2024-05-22 LAB — PARATHYROID HORMONE, INTACT (NO CA): PTH: 30 pg/mL (ref 16–77)

## 2024-05-25 ENCOUNTER — Ambulatory Visit: Payer: Self-pay | Admitting: Family

## 2024-05-25 NOTE — Progress Notes (Signed)
 Alkaline phosphatase remains stable.  Parathyroid  level back to normal with increase in vitamin D  so likely was caused by vitamin D  deficiency, so as of now it is resolved.   Cholesterol looks good.  Thyroid  normal

## 2024-05-31 ENCOUNTER — Ambulatory Visit (INDEPENDENT_AMBULATORY_CARE_PROVIDER_SITE_OTHER): Payer: Medicare Other

## 2024-05-31 VITALS — BP 112/74 | Ht 62.0 in | Wt 170.0 lb

## 2024-05-31 DIAGNOSIS — Z0000 Encounter for general adult medical examination without abnormal findings: Secondary | ICD-10-CM

## 2024-05-31 NOTE — Patient Instructions (Signed)
 Alexandria Mason,  Thank you for taking the time for your Medicare Wellness Visit. I appreciate your continued commitment to your health goals. Please review the care plan we discussed, and feel free to reach out if I can assist you further.  Medicare recommends these wellness visits once per year to help you and your care team stay ahead of potential health issues. These visits are designed to focus on prevention, allowing your provider to concentrate on managing your acute and chronic conditions during your regular appointments.  Please note that Annual Wellness Visits do not include a physical exam. Some assessments may be limited, especially if the visit was conducted virtually. If needed, we may recommend a separate in-person follow-up with your provider.  Ongoing Care Seeing your primary care provider every 3 to 6 months helps us  monitor your health and provide consistent, personalized care.   Referrals If a referral was made during today's visit and you haven't received any updates within two weeks, please contact the referred provider directly to check on the status.  Recommended Screenings:  Health Maintenance  Topic Date Due   Zoster (Shingles) Vaccine (1 of 2) Never done   DEXA scan (bone density measurement)  Never done   Flu Shot  12/14/2024*   Breast Cancer Screening  05/21/2025*   Colon Cancer Screening  05/21/2025*   Medicare Annual Wellness Visit  05/31/2025   DTaP/Tdap/Td vaccine (2 - Td or Tdap) 10/23/2028   Pneumococcal Vaccine for age over 34  Completed   Hepatitis C Screening  Completed   HPV Vaccine  Aged Out   Meningitis B Vaccine  Aged Out   Hepatitis B Vaccine  Discontinued   COVID-19 Vaccine  Discontinued  *Topic was postponed. The date shown is not the original due date.       05/31/2024    3:14 PM  Advanced Directives  Does Patient Have a Medical Advance Directive? No  Would patient like information on creating a medical advance directive? No - Patient  declined   Advance Care Planning is important because it: Ensures you receive medical care that aligns with your values, goals, and preferences. Provides guidance to your family and loved ones, reducing the emotional burden of decision-making during critical moments.  Vision: Annual vision screenings are recommended for early detection of glaucoma, cataracts, and diabetic retinopathy. These exams can also reveal signs of chronic conditions such as diabetes and high blood pressure.  Dental: Annual dental screenings help detect early signs of oral cancer, gum disease, and other conditions linked to overall health, including heart disease and diabetes.  Please see the attached documents for additional preventive care recommendations.

## 2024-05-31 NOTE — Progress Notes (Signed)
 Because this visit was a virtual/telehealth visit,  certain criteria was not obtained, such a blood pressure, CBG if applicable, and timed get up and go. Any medications not marked as taking were not mentioned during the medication reconciliation part of the visit. Any vitals not documented were not able to be obtained due to this being a telehealth visit or patient was unable to self-report a recent blood pressure reading due to a lack of equipment at home via telehealth. Vitals that have been documented are verbally provided by the patient.  This visit was performed by a medical professional under my direct supervision. I was immediately available for consultation/collaboration. I have reviewed and agree with the Annual Wellness Visit documentation.  Subjective:   Alexandria Mason is a 73 y.o. who presents for a Medicare Wellness preventive visit.  As a reminder, Annual Wellness Visits don't include a physical exam, and some assessments may be limited, especially if this visit is performed virtually. We may recommend an in-person follow-up visit with your provider if needed.  Visit Complete: Virtual I connected with  Alfrieda Pines on 05/31/24 by a audio enabled telemedicine application and verified that I am speaking with the correct person using two identifiers.  Patient Location: Home  Provider Location: Home Office  I discussed the limitations of evaluation and management by telemedicine. The patient expressed understanding and agreed to proceed.  Vital Signs: Because this visit was a virtual/telehealth visit, some criteria may be missing or patient reported. Any vitals not documented were not able to be obtained and vitals that have been documented are patient reported.  VideoDeclined- This patient declined Librarian, academic. Therefore the visit was completed with audio only.  Persons Participating in Visit: Patient.  AWV Questionnaire: No: Patient  Medicare AWV questionnaire was not completed prior to this visit.  Cardiac Risk Factors include: advanced age (>1men, >59 women);obesity (BMI >30kg/m2);dyslipidemia     Objective:    Today's Vitals   05/31/24 1515  BP: 112/74  Weight: 170 lb (77.1 kg)  Height: 5' 2 (1.575 m)   Body mass index is 31.09 kg/m.     05/31/2024    3:14 PM 05/21/2023    2:31 PM 12/12/2021    9:34 AM 08/18/2020    4:52 AM 08/16/2020   10:22 PM 11/26/2019    2:49 PM  Advanced Directives  Does Patient Have a Medical Advance Directive? No No No No No No  Would patient like information on creating a medical advance directive? No - Patient declined  No - Patient declined No - Patient declined  No - Patient declined    Current Medications (verified) Outpatient Encounter Medications as of 05/31/2024  Medication Sig   atorvastatin  (LIPITOR) 20 MG tablet TAKE 1 TABLET BY MOUTH EVERY DAY   cholecalciferol  (VITAMIN D3) 25 MCG (1000 UNIT) tablet Take 2,000 Units by mouth daily.   meclizine  (ANTIVERT ) 12.5 MG tablet Take 1 tablet (12.5 mg total) by mouth 3 (three) times daily as needed for dizziness.   Naproxen Sodium 220 MG CAPS Take by mouth daily as needed.   propranolol  (INDERAL ) 10 MG tablet TAKE 1 TABLET BY MOUTH DAILY AS NEEDED 1 HOUR BEFORE ANXIOUS EVENT   No facility-administered encounter medications on file as of 05/31/2024.    Allergies (verified) Oxycodone-acetaminophen  and Sulfa antibiotics   History: Past Medical History:  Diagnosis Date   Acute hypoxemic respiratory failure due to COVID-19 [facility]) 08/18/2020   Gastroenteritis due to COVID-19 virus 08/18/2020   Past Surgical  History:  Procedure Laterality Date   BUNIONECTOMY     x 2-bilateral feet- different years for each one   CATARACT EXTRACTION Left 2011   oral surgeries     WRIST FRACTURE SURGERY Right 08/2006   metal plate present   Family History  Problem Relation Age of Onset   Congestive Heart Failure Mother    Heart disease  Father    Heart attack Father 17   Heart disease Brother    Heart attack Paternal Grandfather    Social History   Socioeconomic History   Marital status: Divorced    Spouse name: Not on file   Number of children: 1   Years of education: some college   Highest education level: Not on file  Occupational History    Employer: RETIRED  Tobacco Use   Smoking status: Never   Smokeless tobacco: Never  Vaping Use   Vaping status: Never Used  Substance and Sexual Activity   Alcohol use: Not Currently    Comment: rarely   Drug use: Never   Sexual activity: Yes    Partners: Male  Other Topics Concern   Not on file  Social History Narrative   Living on social security, retired from Engineering geologist   Lives alone   One daughter - Eleanor    Enjoys: going to the gym, walking with her neighbor   Exercise: gym 3 times a week   Social support - yes, good friends, daughter   Diet: pretty healthy - salads, meats, chicken   Social Drivers of Health   Financial Resource Strain: Low Risk  (05/31/2024)   Overall Financial Resource Strain (CARDIA)    Difficulty of Paying Living Expenses: Not hard at all  Food Insecurity: No Food Insecurity (05/31/2024)   Hunger Vital Sign    Worried About Running Out of Food in the Last Year: Never true    Ran Out of Food in the Last Year: Never true  Transportation Needs: No Transportation Needs (05/31/2024)   PRAPARE - Administrator, Civil Service (Medical): No    Lack of Transportation (Non-Medical): No  Physical Activity: Sufficiently Active (05/31/2024)   Exercise Vital Sign    Days of Exercise per Week: 7 days    Minutes of Exercise per Session: 30 min  Stress: No Stress Concern Present (05/31/2024)   [employer] of Occupational Health - Occupational Stress Questionnaire    Feeling of Stress: Not at all  Social Connections: Moderately Integrated (05/31/2024)   Social Connection and Isolation Panel    Frequency of Communication with Friends  and Family: More than three times a week    Frequency of Social Gatherings with Friends and Family: More than three times a week    Attends Religious Services: More than 4 times per year    Active Member of [employer] or Organizations: Yes    Attends Engineer, structural: More than 4 times per year    Marital Status: Divorced    Tobacco Counseling Counseling given: Not Answered    Clinical Intake:  Pre-visit preparation completed: Yes  Pain : No/denies pain     BMI - recorded: 31.09 Nutritional Status: BMI > 30  Obese Nutritional Risks: None Diabetes: No  Lab Results  Component Value Date   HGBA1C 5.6 10/20/2018     How often do you need to have someone help you when you read instructions, pamphlets, or other written materials from your doctor or pharmacy?: 1 - Never  Interpreter Needed?: No  Information entered by :: Kayler Buckholtz,cma   Activities of Daily Living     05/31/2024    3:19 PM  In your present state of health, do you have any difficulty performing the following activities:  Hearing? 0  Vision? 0  Difficulty concentrating or making decisions? 0  Walking or climbing stairs? 0  Dressing or bathing? 0  Doing errands, shopping? 0  Preparing Food and eating ? N  Using the Toilet? N  In the past six months, have you accidently leaked urine? N  Do you have problems with loss of bowel control? N  Managing your Medications? N  Managing your Finances? N  Housekeeping or managing your Housekeeping? N    Patient Care Team: Corwin Antu, FNP as PCP - General (Family Medicine)  I have updated your Care Teams any recent Medical Services you may have received from other providers in the past year.     Assessment:   This is a routine wellness examination for [place].  Hearing/Vision screen Hearing Screening - Comments:: No difficulties Vision Screening - Comments:: Patient wears    Goals Addressed             This Visit's Progress     Patient Stated   On track    05/21/2023, wants to lose weight       Depression Screen     05/31/2024    3:21 PM 05/21/2024    9:31 AM 05/21/2023    2:33 PM 03/04/2023   12:30 PM 01/30/2023   11:27 AM 12/12/2021    9:40 AM 11/26/2019    2:52 PM  PHQ 2/9 Scores  PHQ - 2 Score 0 0 0 0 0 0 0  PHQ- 9 Score 0 0 0 0 2  0    Fall Risk     05/31/2024    3:17 PM 05/21/2024    9:31 AM 05/21/2023    2:32 PM 03/04/2023   12:30 PM 03/04/2023   10:43 AM  Fall Risk   Falls in the past year? 0 0 0 0 0  Number falls in past yr: 0 0 0 0 0  Injury with Fall? 0 0 0 0 0  Risk for fall due to : No Fall Risks No Fall Risks Medication side effect No Fall Risks No Fall Risks  Follow up Falls evaluation completed Falls evaluation completed Falls prevention discussed;Falls evaluation completed Falls evaluation completed Falls evaluation completed    MEDICARE RISK AT HOME:  Medicare Risk at Home Any stairs in or around the home?: No If so, are there any without handrails?: No Home free of loose throw rugs in walkways, pet beds, electrical cords, etc?: Yes Adequate lighting in your home to reduce risk of falls?: Yes Life alert?: Yes Use of a cane, walker or w/c?: No Grab bars in the bathroom?: No Shower chair or bench in shower?: No Elevated toilet seat or a handicapped toilet?: Yes  TIMED UP AND GO:  Was the test performed?  No  Cognitive Function: 6CIT completed    11/26/2019    2:54 PM  MMSE - Mini Mental State Exam  Orientation to time 5  Orientation to Place 5  Registration 3  Attention/ Calculation 5  Recall 3  Language- repeat 1        05/31/2024    3:22 PM 05/21/2023    2:33 PM 12/25/2021   12:02 PM  6CIT Screen  What Year? 0 points 0 points 0 points  What month? 0 points  0 points 0 points  What time? 0 points 0 points 0 points  Count back from 20 0 points 0 points 0 points  Months in reverse 0 points 0 points 0 points  Repeat phrase 0 points 0 points 0 points  Total Score 0 points 0  points 0 points    Immunizations Immunization History  Administered Date(s) Administered   Hep A / Hep B 04/30/2023   Pneumococcal Conjugate-13 10/20/2018   Pneumococcal Polysaccharide-23 11/30/2019   Tdap 10/23/2018    Screening Tests Health Maintenance  Topic Date Due   Zoster Vaccines- Shingrix (1 of 2) Never done   DEXA SCAN  Never done   Influenza Vaccine  12/14/2024 (Originally 04/16/2024)   Mammogram  05/21/2025 (Originally 11/05/1989)   Colonoscopy  05/21/2025 (Originally 11/05/1994)   Medicare Annual Wellness (AWV)  05/31/2025   DTaP/Tdap/Td (2 - Td or Tdap) 10/23/2028   Pneumococcal Vaccine: 50+ Years  Completed   Hepatitis C Screening  Completed   HPV VACCINES  Aged Out   Meningococcal B Vaccine  Aged Out   Hepatitis B Vaccines 19-59 Average Risk  Discontinued   COVID-19 Vaccine  Discontinued    Health Maintenance Items Addressed:patient declined   Additional Screening:  Vision Screening: Recommended annual ophthalmology exams for early detection of glaucoma and other disorders of the eye. Is the patient up to date with their annual eye exam?  No  Who is the provider or what is the name of the office in which the patient attends annual eye exams?   Dental Screening: Recommended annual dental exams for proper oral hygiene  Community Resource Referral / Chronic Care Management: CRR required this visit?  No   CCM required this visit?  No   Plan:    I have personally reviewed and noted the following in the patient's chart:   Medical and social history Use of alcohol, tobacco or illicit drugs  Current medications and supplements including opioid prescriptions. Patient is not currently taking opioid prescriptions. Functional ability and status Nutritional status Physical activity Advanced directives List of other physicians Hospitalizations, surgeries, and ER visits in previous 12 months Vitals Screenings to include cognitive, depression, and  falls Referrals and appointments  In addition, I have reviewed and discussed with patient certain preventive protocols, quality metrics, and best practice recommendations. A written personalized care plan for preventive services as well as general preventive health recommendations were provided to patient.   Lyle MARLA Right, [place]   05/31/2024   After Visit Summary: (MyChart) Due to this being a telephonic visit, the after visit summary with patients personalized plan was offered to patient via MyChart   Notes: Nothing significant to report at this time.

## 2024-07-08 ENCOUNTER — Other Ambulatory Visit: Payer: Self-pay | Admitting: Family

## 2024-07-08 DIAGNOSIS — E785 Hyperlipidemia, unspecified: Secondary | ICD-10-CM

## 2025-05-24 ENCOUNTER — Encounter: Admitting: Family

## 2025-06-01 ENCOUNTER — Ambulatory Visit
